# Patient Record
Sex: Female | Born: 1995 | Race: Black or African American | Hispanic: No | Marital: Single | Smoking: Never smoker
Health system: Southern US, Community
[De-identification: ages and names within clinical notes are randomized; demographics above are authoritative.]

## PROBLEM LIST (undated history)

## (undated) ENCOUNTER — Emergency Department: Payer: Medicaid Other

## (undated) HISTORY — PX: DENTAL SURGERY: SHX609

---

## 2017-01-01 ENCOUNTER — Encounter: Payer: Self-pay | Admitting: Emergency Medicine

## 2017-01-01 ENCOUNTER — Emergency Department
Admission: EM | Admit: 2017-01-01 | Discharge: 2017-01-01 | Disposition: A | Discharge status: Home / Self Care | Payer: Self-pay | Attending: Emergency Medicine | Admitting: Emergency Medicine

## 2017-01-01 DIAGNOSIS — J029 Acute pharyngitis, unspecified: Secondary | ICD-10-CM | POA: Insufficient documentation

## 2017-01-01 LAB — POCT RAPID STREP A: STREPTOCOCCUS, GROUP A SCREEN (DIRECT): NEGATIVE

## 2017-01-01 MED ORDER — AMOXICILLIN 875 MG PO TABS: 875.0000 mg | ORAL_TABLET | Freq: Two times a day (BID) | ORAL | 0 refills | Status: DC

## 2017-01-01 MED ORDER — FLUTICASONE PROPIONATE 50 MCG/ACT NA SUSP: 2.0000 | Freq: Every day | NASAL | 0 refills | Status: DC

## 2017-01-01 NOTE — ED Notes (Signed)
See triage note   States she developed sore throat dry cough and swollen glands for 2 days   Afebrile on arrival

## 2017-01-01 NOTE — ED Notes (Signed)
Strep test performed

## 2017-01-01 NOTE — ED Provider Notes (Signed)
[facility] Emergency Department Provider Note  ____________________________________________  Time seen: Approximately 10:05 AM  I have reviewed the triage vital signs and the nursing notes.   HISTORY  Chief Complaint Sore Throat    HPI Sheryl Harrison is a 21 y.o. female , NAD, presents to the emergency department with a three-day history of sore throat, body aches and chills. Patient states sore throat began approximately 3 days ago. Sore throat has progressively worsened and she feels that her tonsils are swollen. Has had body aches and chills but no fevers or sweats. Has had occasional nausea but no vomiting or diarrhea. Denies any chest pain, shortness breath or abdominal pain. Denies any difficulty swallowing or breathing. No swelling about the lips/tongue/throat. Has had no rashes. No cough or chest congestion. Endorses sinus pressure and ear pressure but no nasal congestion or runny nose. Has a history of seasonal allergies but is not taking anything for such at this time. Denies any sick contacts. Has not had a flu vaccine.   History reviewed. No pertinent past medical history.  There are no active problems to display for this patient.   Past Surgical History:  Procedure Laterality Date  . DENTAL SURGERY      Prior to Admission medications   Medication Sig Start Date End Date Taking? Authorizing Provider  amoxicillin (AMOXIL) 875 MG tablet Take 1 tablet (875 mg total) by mouth 2 (two) times daily. 01/01/17   Tayana Shankle L Elek Holderness, PA-C  fluticasone (FLONASE) 50 MCG/ACT nasal spray Place 2 sprays into both nostrils daily. 01/01/17   Teighan Aubert L Samariya Rockhold, PA-C    Allergies Patient has no known allergies.  History reviewed. No pertinent family history.  Social History Social History  Substance Use Topics  . Smoking status: Never Smoker  . Smokeless tobacco: Never Used  . Alcohol use Yes     Review of Systems  Constitutional: Positive chills. No fever or  sweats. Eyes: No visual changes. No discharge ENT: Positive sore throat, sinus pressure, ear pressure. No nasal congestion, runny nose, ear drainage. Cardiovascular: No chest pain. Respiratory: No cough or chest congestion. No shortness of breath. No wheezing.  Gastrointestinal: Positive nausea without vomiting. No abdominal pain.   No diarrhea.   Musculoskeletal: Positive for general myalgias.  Skin: Negative for rash. Neurological: Negative for headaches. 10-point ROS otherwise negative.  ____________________________________________   PHYSICAL EXAM:  VITAL SIGNS: ED Triage Vitals  Enc Vitals Group     BP 01/01/17 0936 116/66     Pulse Rate 01/01/17 0936 80     Resp 01/01/17 0936 16     Temp 01/01/17 0936 98.3 F (36.8 C)     Temp Source 01/01/17 0936 Oral     SpO2 01/01/17 0936 99 %     Weight 01/01/17 0935 152 lb (68.9 kg)     Height 01/01/17 0935 5\' 2"  (1.575 m)     Head Circumference --      Peak Flow --      Pain Score 01/01/17 0935 7     Pain Loc --      Pain Edu? --      Excl. in GC? --      Constitutional: Alert and oriented. Well appearing and in no acute distress. Eyes: Conjunctivae are normal.  Head: Atraumatic. ENT:      Ears: TMs visual eyes bilaterally without erythema, bulging, effusion or perforation.      Nose: No congestion/rhinnorhea.      Mouth/Throat: Mucous membranes  are moist. Trace bilateral tonsillar swelling with mild injection but no exudate. Posterior pharynx with mild cobblestoning but no erythema or swelling. Clear postnasal drip. Uvula is midline. Airway is patent. Neck: No stridor. Supple with full range of motion. Hematological/Lymphatic/Immunilogical: Positive left, anterior, focal cervical lymphadenopathy with tenderness to palpation but is mobile. Cardiovascular: Normal rate, regular rhythm. Normal S1 and S2.  Good peripheral circulation. Respiratory: Normal respiratory effort without tachypnea or retractions. Lungs CTAB with breath  sounds noted in all lung fields. No wheeze, rhonchi, rales Neurologic:  Normal speech and language. No gross focal neurologic deficits are appreciated.  Skin:  Skin is warm, dry and intact. No rash noted. Psychiatric: Mood and affect are normal. Speech and behavior are normal. Patient exhibits appropriate insight and judgement.   ____________________________________________   LABS (all labs ordered are listed, but only abnormal results are displayed)  Labs Reviewed  CULTURE, GROUP A STREP [facility])  POCT RAPID STREP A   ____________________________________________  EKG  None ____________________________________________  RADIOLOGY  None ____________________________________________    PROCEDURES  Procedure(s) performed: None   Procedures   Medications - No data to display   ____________________________________________   INITIAL IMPRESSION / ASSESSMENT AND PLAN / ED COURSE  Pertinent labs & imaging results that were available during my care of the patient were reviewed by me and considered in my medical decision making (see chart for details).  Clinical Course     Patient's diagnosis is consistent with Acute pharyngitis. Rapid strep was negative but symptoms are consistent with bacterial infection and with enlarged lymph nodes I felt it best to treat the patient with antibiotics at this time. Throat culture was ordered and patient will be called with results when available. Patient will be discharged home with prescriptions for amoxicillin and Flonase to take as directed. May take over-the-counter Tylenol or ibuprofen as needed. Patient is to follow up with one of the outpatient community clinics such [facility], [facility], [facility], [facility] if symptoms persist past this treatment course. Patient is given ED precautions to return to the ED for any worsening or new symptoms.     ____________________________________________  FINAL CLINICAL IMPRESSION(S) / ED DIAGNOSES  Final diagnoses:  Acute pharyngitis, unspecified etiology      NEW MEDICATIONS STARTED DURING THIS VISIT:  Discharge Medication List as of 01/01/2017 10:42 AM    START taking these medications   Details  amoxicillin (AMOXIL) 875 MG tablet Take 1 tablet (875 mg total) by mouth 2 (two) times daily., Starting Wed 01/01/2017, Print    fluticasone (FLONASE) 50 MCG/ACT nasal spray Place 2 sprays into both nostrils daily., Starting Wed 01/01/2017, Print             Ernestene KielJami L CottonwoodHagler, PA-C 01/01/17 1100    Sharman CheekPhillip Stafford, MD 01/01/17 [phone]

## 2017-01-01 NOTE — ED Triage Notes (Signed)
C/o sore throat since jan 1. Denies fevers. Pain with swallowing but is handling secretions without difficulty. Tonsils swollen. No respiratory distress.

## 2017-01-03 LAB — CULTURE, GROUP A STREP (THRC)

## 2017-12-30 HISTORY — PX: WISDOM TOOTH EXTRACTION: SHX21

## 2018-04-22 ENCOUNTER — Encounter: Payer: Self-pay | Admitting: Emergency Medicine

## 2018-04-22 ENCOUNTER — Other Ambulatory Visit: Payer: Self-pay

## 2018-04-22 ENCOUNTER — Emergency Department
Admission: EM | Admit: 2018-04-22 | Discharge: 2018-04-22 | Disposition: A | Discharge status: Home / Self Care | Payer: Self-pay | Attending: Emergency Medicine | Admitting: Emergency Medicine

## 2018-04-22 DIAGNOSIS — M722 Plantar fascial fibromatosis: Secondary | ICD-10-CM | POA: Insufficient documentation

## 2018-04-22 MED ORDER — PREDNISONE 10 MG PO TABS: ORAL_TABLET | ORAL | 0 refills | Status: DC

## 2018-04-22 MED ORDER — KETOROLAC TROMETHAMINE 10 MG PO TABS: 10.0000 mg | ORAL_TABLET | Freq: Four times a day (QID) | ORAL | 0 refills | Status: DC | PRN

## 2018-04-22 MED ORDER — KETOROLAC TROMETHAMINE 30 MG/ML IJ SOLN
30.0000 mg | Freq: Once | INTRAMUSCULAR | Status: AC
  Administered 2018-04-22: 30 mg via INTRAMUSCULAR
  Filled 2018-04-22: qty 1

## 2018-04-22 NOTE — ED Provider Notes (Signed)
[facility] Emergency Department Provider Note  ____________________________________________  Time seen: Approximately 2:44 PM  I have reviewed the triage vital signs and the nursing notes.   HISTORY  Chief Complaint Foot Pain    HPI Sheryl Harrison is a 22 y.o. female that presents emergency department for evaluation of left foot pain for 1 week.  Pain is worse in the morning right when she gets up and steps on her foot.  Pain is primarily on the lateral side of her foot.  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  No trauma.  No numbness, tingling.   History reviewed. No pertinent past medical history.  There are no active problems to display for this patient.   Past Surgical History:  Procedure Laterality Date  . DENTAL SURGERY      Prior to Admission medications   Medication Sig Start Date End Date Taking? Authorizing Provider  amoxicillin (AMOXIL) 875 MG tablet Take 1 tablet (875 mg total) by mouth 2 (two) times daily. 01/01/17   Hagler, Jami L, PA-C  fluticasone (FLONASE) 50 MCG/ACT nasal spray Place 2 sprays into both nostrils daily. 01/01/17   Hagler, Jami L, PA-C  ketorolac (TORADOL) 10 MG tablet Take 1 tablet (10 mg total) by mouth every 6 (six) hours as needed. 04/22/18   Enid DerryWagner, Keah Lamba, PA-C  predniSONE (DELTASONE) 10 MG tablet Take 6 tablets on day 1, take 5 tablets on day 2, take 4 tablets on day 3, take 3 tablets on day 4, take 2 tablets on day 5, take 1 tablet on day 6 04/22/18   Enid DerryWagner, Janiya Millirons, PA-C    Allergies Apple  No family history on file.  Social History Social History   Tobacco Use  . Smoking status: Never Smoker  . Smokeless tobacco: Never Used  Substance Use Topics  . Alcohol use: Yes  . Drug use: No     Review of Systems  Constitutional: No fever/chills Gastrointestinal: No abdominal pain.  No nausea, no vomiting.  Musculoskeletal: Positive for foot pain.  Skin: Negative for rash, abrasions,  lacerations, ecchymosis. Neurological: Negative for headaches, numbness or tingling   ____________________________________________   PHYSICAL EXAM:  VITAL SIGNS: ED Triage Vitals  Enc Vitals Group     BP 04/22/18 1344 114/75     Pulse Rate 04/22/18 1344 77     Resp 04/22/18 1344 18     Temp 04/22/18 1344 98.2 F (36.8 C)     Temp Source 04/22/18 1344 Oral     SpO2 04/22/18 1344 99 %     Weight 04/22/18 1344 157 lb (71.2 kg)     Height 04/22/18 1344 5\' 2"  (1.575 m)     Head Circumference --      Peak Flow --      Pain Score 04/22/18 1346 8     Pain Loc --      Pain Edu? --      Excl. in GC? --      Constitutional: Alert and oriented. Well appearing and in no acute distress. Eyes: Conjunctivae are normal. PERRL. EOMI. Head: Atraumatic. ENT:      Ears:      Nose: No congestion/rhinnorhea.      Mouth/Throat: Mucous membranes are moist.  Neck: No stridor.   Cardiovascular: Good peripheral circulation. Respiratory: Normal respiratory effort without tachypnea or retractions.  Musculoskeletal: Full range of motion to all extremities. No gross deformities appreciated.  No calf tenderness.  No  swelling or erythema.  Tenderness to palpation over lateral plantar side of left foot.  Full range of motion of toes.  No wounds. Neurologic:  Normal speech and language. No gross focal neurologic deficits are appreciated.  Skin:  Skin is warm, dry and intact. No rash noted.   ____________________________________________   LABS (all labs ordered are listed, but only abnormal results are displayed)  Labs Reviewed - No data to display ____________________________________________  EKG   ____________________________________________  RADIOLOGY   No results found.  ____________________________________________    PROCEDURES  Procedure(s) performed:    Procedures    Medications  ketorolac (TORADOL) 30 MG/ML injection 30 mg (30 mg Intramuscular Given 04/22/18 1447)      ____________________________________________   INITIAL IMPRESSION / ASSESSMENT AND PLAN / ED COURSE  Pertinent labs & imaging results that were available during my care of the patient were reviewed by me and considered in my medical decision making (see chart for details).  Review of the [place] CSRS was performed in accordance of the [facility] prior to dispensing any controlled drugs.     Patient's diagnosis is consistent with plantar fasciitis.  Vital signs and exam are reassuring. We discussed that imaging is unlikely to be beneficial and patient is agreeable. IM Toradol was given.  She is requesting crutches. Patient will be discharged home with prescriptions for Toradol and prednisone. Patient is to follow up with podiatry as directed. Patient is given ED precautions to return to the ED for any worsening or new symptoms.     ____________________________________________  FINAL CLINICAL IMPRESSION(S) / ED DIAGNOSES  Final diagnoses:  Plantar fasciitis      NEW MEDICATIONS STARTED DURING THIS VISIT:  ED Discharge Orders        Ordered    predniSONE (DELTASONE) 10 MG tablet     04/22/18 1448    ketorolac (TORADOL) 10 MG tablet  Every 6 hours PRN     04/22/18 1448          This chart was dictated using voice recognition software/Dragon. Despite best efforts to proofread, errors can occur which can change the meaning. Any change was purely unintentional.    Enid Derry, PA-C 04/22/18 1502    Phineas Semen, MD 04/22/18 [phone]

## 2018-04-22 NOTE — ED Triage Notes (Signed)
States he woke up with pain and swelling to left foot    Pain is mainly to lateral foot and the sole of foot

## 2018-10-14 LAB — HM PAP SMEAR: HM Pap smear: NORMAL

## 2019-03-16 DIAGNOSIS — E669 Obesity, unspecified: Secondary | ICD-10-CM | POA: Insufficient documentation

## 2019-03-16 LAB — HM HIV SCREENING LAB: HM HIV Screening: NEGATIVE

## 2020-05-11 ENCOUNTER — Other Ambulatory Visit: Payer: Self-pay

## 2020-05-11 ENCOUNTER — Encounter: Payer: Self-pay | Admitting: Family Medicine

## 2020-05-11 ENCOUNTER — Ambulatory Visit (LOCAL_COMMUNITY_HEALTH_CENTER): Payer: Medicaid Other | Admitting: Family Medicine

## 2020-05-11 VITALS — BP 102/57 | Ht 62.0 in | Wt 176.4 lb

## 2020-05-11 DIAGNOSIS — Z3009 Encounter for other general counseling and advice on contraception: Secondary | ICD-10-CM | POA: Diagnosis not present

## 2020-05-11 DIAGNOSIS — Z6830 Body mass index (BMI) 30.0-30.9, adult: Secondary | ICD-10-CM

## 2020-05-11 DIAGNOSIS — Z113 Encounter for screening for infections with a predominantly sexual mode of transmission: Secondary | ICD-10-CM

## 2020-05-11 MED ORDER — NORETHIN ACE-ETH ESTRAD-FE 1.5-30 MG-MCG PO TABS
1.0000 | ORAL_TABLET | Freq: Every day | ORAL | 0 refills | Status: DC
Start: 2020-05-11 — End: 2020-05-11

## 2020-05-11 MED ORDER — NORETHIN ACE-ETH ESTRAD-FE 1-20 MG-MCG PO TABS
1.0000 | ORAL_TABLET | Freq: Every day | ORAL | 3 refills | Status: AC
Start: 2020-05-11 — End: ?

## 2020-05-11 NOTE — Progress Notes (Signed)
Family Planning Visit- Initial Visit  Subjective:  Sheryl Harrison is a 24 y.o.  No obstetric history on file.  being seen today for an initial well woman visit and to discuss family planning options. Patient reports they do nto want a pregnancy in the next year.   Chief Complaint  Patient presents with  . Annual Exam  . Contraception    Pt has Obesity, unspecified; Allergic rhinitis; and Chronic headaches on their problem list.   HPI  Patient reports she is here for physical exam and to discuss [facility] options. She is interested in the pill.   She is late for her period. Had bleeding 4/3 but it was shorter, abn period. Endorses occ dizziness, nausea.   Pt does not meet any of the following contraindications to estrogen use: -Age ?35 years and smoking ?15 cigarettes per day -Migraine with aura (she has had HA recently, denies vision problems w/these) -Two or more RF for arterial CVD (such as older age, smoking, diabetes, and hypertension) -HTN -Breast cancer -VTE hx or acute event -Known thrombogenic mutations -Known ischemic heart disease -History of stroke -Complicated valvular heart disease (pulmonary HTN, risk for afib, hx subacute bacterial endocarditis) -Cirrhosis, Hepatocellular adenoma or malignant hepatoma    Patient's last menstrual period was 04/01/2020 (exact date). Last sex: 03/11/20 BCM: none Pt desires EC? n/a  Last pap: 10/14/2018 = normal  Last breast exam: 1 year ago Personal/family hx breast cancer? no  Patient reports 1 partner(s) in last year. Do they desire STI screening (if no, why not)? Yes. Denies symptoms.   Does the patient desire a pregnancy in the next year? no   24 y.o., Body mass index is 32.26 kg/m. - Is patient eligible for HA1C diabetes screening based on BMI and age >49?  no  Has patient been screened once for HCV in the past?  no  No results found for: HCVAB  Does the patient have current of drug use, have a partner with drug use,  and/or has been incarcerated since last result? no If yes-- Screen for HCV through [facility]   Does the patient meet criteria for HBV testing? no  Criteria:  -Household, sexual or needle sharing contact with HBV -History of drug use -HIV positive -Those with known Hep C  See flowsheet for other program required questions.    Health Maintenance Due  Topic Date Due  . COVID-19 Vaccine (1) Never done  . TETANUS/TDAP  Never done    ROS HA: come/go past few months. Denies vision disturbances with these. Dizziness: x3 wks Nausea: x3 wks   The following portions of the patient's history were reviewed and updated as appropriate: allergies, current medications, past family history, past medical history, past social history, past surgical history and problem list. Problem list updated.   See flowsheet for other program required questions.  Objective:   Vitals:   05/11/20 1535  BP: (!) 102/57  Weight: 176 lb 6.4 oz (80 kg)  Height: 5\' 2"  (1.575 m)    Vitals and nursing note reviewed.  Constitutional:      Appearance: Normal appearance.  HENT:     Head: Normocephalic and atraumatic.     Mouth/Throat:     Mouth: Mucous membranes are moist.     Pharynx: Oropharynx is clear. No oropharyngeal exudate or posterior oropharyngeal erythema.  Eyes:     Conjunctiva/sclera: Conjunctivae normal.  Neck:     Thyroid: No thyroid mass, thyromegaly or thyroid tenderness.  Cardiovascular:  Rate and Rhythm: Normal rate and regular rhythm.     Pulses: Normal pulses.     Heart sounds: Normal heart sounds.  Pulmonary:     Effort: Pulmonary effort is normal.     Breath sounds: Normal breath sounds.  Abdominal:     General: Abdomen is flat.     Palpations: There is no mass.     Tenderness: There is no abdominal tenderness. There is no rebound.  Genitourinary:     General: Normal vulva.     Exam position: Lithotomy position.     Pubic Area: No rash or pubic lice.      Labia:         Right: No rash or lesion.        Left: No rash or lesion.      Vagina: Normal. No vaginal erythema, bleeding or lesions. +white discharge, ph>4.5    Cervix: No cervical motion tenderness, discharge, friability, lesion or erythema.     Uterus: Normal.      Adnexa: Right adnexa normal and left adnexa normal.     Rectum: Normal.  Lymphadenopathy:     Head:     Right side of head: No preauricular or posterior auricular adenopathy.     Left side of head: No preauricular or posterior auricular adenopathy.     Cervical: No cervical adenopathy.     Upper Body:     Right upper body: No supraclavicular or axillary adenopathy.     Left upper body: No supraclavicular or axillary adenopathy.     Lower Body: No right inguinal adenopathy. No left inguinal adenopathy.  Skin:    General: Skin is warm and dry.     Findings: No rash.  Neurological:     Mental Status: She is alert and oriented to person, place, and time.       Assessment and Plan:  Sheryl Harrison is a 24 y.o. female presenting to the [facility] Department for an initial well woman exam/family planning visit.  Contraception counseling: Reviewed all forms of birth control options in the tiered based approach. available including abstinence; over the counter/barrier methods; hormonal contraceptive medication including pill, patch, ring, injection,contraceptive implant, ECP; hormonal and nonhormonal IUDs; permanent sterilization options including vasectomy and the various tubal sterilization modalities. Risks, benefits, and typical effectiveness rates were reviewed.  Questions were answered.  Written information was also given to the patient to review.  Patient desires OCP, this was prescribed for patient. She will follow up in  1 year for surveillance.  She was told to call with any further questions, or with any concerns about this method of contraception.  Emphasized use of condoms 100% of the time for STI  prevention.  Emergency Contraception: n/a   1. Family planning services -BCM: rx OCP x1 yr today. Counseling as above. She will see PCP for f/u of HAs, but advised if HA worsening w/OCP to RTC to switch BCM. -Pap: due next year -CBE: due next year -Recommended PCP f/u for HA, nausea, dizziness. Handout w/local providers given. - Pregnancy, urine - norethindrone-ethinyl estradiol-iron (MICROGESTIN FE 1.5/30) 1.5-30 MG-MCG tablet; Take 1 tablet by mouth daily.  Dispense: 3 Packages; Refill: 3  2. Screening examination for venereal disease -Pt without symptoms. Screenings today as below. Treat wet prep per standing order. -Patient does not meet criteria for HepB, HepC Screening.  -Counseled on warning s/sx and when to seek care. Recommended condom use with all sex and discussed importance of condom use for  STI prevention. - WET PREP FOR TRICH, YEAST, CLUE - Chlamydia/Gonorrhea [facility] - HIV [place] [facility] - Syphilis Serology,  Lab     Return in about 1 year (around 05/11/2021) for yearly wellness exam.  No future appointments.  Ann Held, PA-C

## 2020-05-11 NOTE — Progress Notes (Signed)
Patient here for PE and to discuss BCM, considering OC. Last PE 03/16/2019, Last Pap 10/14/2018, NIL. Next Pap due 2020 per [facility].Burt Knack, RN

## 2020-05-11 NOTE — Progress Notes (Signed)
Wet mount reviewed, no treatment indicated. PT negative. Patient signed OC consent, and will pick up prescription at pharmacy.Burt Knack, RN

## 2020-05-12 ENCOUNTER — Encounter: Payer: Self-pay | Admitting: Family Medicine

## 2020-05-12 LAB — WET PREP FOR TRICH, YEAST, CLUE
Trichomonas Exam: NEGATIVE
Yeast Exam: NEGATIVE

## 2020-05-12 LAB — PREGNANCY, URINE: Preg Test, Ur: NEGATIVE

## 2020-08-01 ENCOUNTER — Ambulatory Visit (LOCAL_COMMUNITY_HEALTH_CENTER): Payer: Medicaid Other

## 2020-08-01 ENCOUNTER — Other Ambulatory Visit: Payer: Self-pay

## 2020-08-01 VITALS — BP 110/76 | Ht 62.0 in | Wt 178.0 lb

## 2020-08-01 DIAGNOSIS — Z3202 Encounter for pregnancy test, result negative: Secondary | ICD-10-CM

## 2020-08-01 LAB — PREGNANCY, URINE: Preg Test, Ur: NEGATIVE

## 2020-08-01 NOTE — Progress Notes (Signed)
UPT negative today. LMP 06/23/20. Last sex 07/01/20. Reports using ECP day after 07/01/20. Marland Kitchen Has ocp but has not started yet. Reports wanting to determine if preg first. Negative results given and explained today. Community provider list given. States she wants to find out why period not starting. Declines multivitamins, reports supply at home. Jerel Shepherd, RN

## 2020-12-29 ENCOUNTER — Emergency Department: Payer: Medicaid Other

## 2020-12-29 ENCOUNTER — Other Ambulatory Visit: Payer: Self-pay

## 2020-12-29 ENCOUNTER — Emergency Department
Admission: EM | Admit: 2020-12-29 | Discharge: 2020-12-29 | Disposition: A | Discharge status: Home / Self Care | Payer: Medicaid Other | Attending: Emergency Medicine | Admitting: Emergency Medicine

## 2020-12-29 DIAGNOSIS — M722 Plantar fascial fibromatosis: Secondary | ICD-10-CM | POA: Diagnosis not present

## 2020-12-29 DIAGNOSIS — M79671 Pain in right foot: Secondary | ICD-10-CM | POA: Diagnosis present

## 2020-12-29 MED ORDER — NAPROXEN 500 MG PO TABS: 500.0000 mg | ORAL_TABLET | Freq: Two times a day (BID) | ORAL | Status: DC

## 2020-12-29 NOTE — ED Provider Notes (Signed)
[facility] Emergency Department Provider Note   ____________________________________________   Event Date/Time   First MD Initiated Contact with Patient 12/29/20 1229     (approximate)  I have reviewed the triage vital signs and the nursing notes.   HISTORY  Chief Complaint Foot Pain    HPI Sheryl Harrison is a 24 y.o. female patient presents with a complaint of bilateral posterior medial foot pain.  States right greater than left.  Patient was diagnosed in 2019 with plantar fasciitis.  There is no imaging or podiatry evaluation and records.  Patient state in the past week she has increasing pain and today she cannot work secondary to the complaint.  Patient was told by her employee to have her complaint evaluated and provide him with description of her complaint.  Patient rates her pain as 8/10.  Patient describes pain as "aching".  Patient said no relief with inserts and anti-inflammatory medications.      No past medical history on file.  Patient Active Problem List   Diagnosis Date Noted  . Obesity, unspecified 03/16/2019  . Allergic rhinitis 03/11/2012  . Chronic headaches 03/11/2012    Past Surgical History:  Procedure Laterality Date  . DENTAL SURGERY    . WISDOM TOOTH EXTRACTION  2019    Prior to Admission medications   Medication Sig Start Date End Date Taking? Authorizing Provider  naproxen (NAPROSYN) 500 MG tablet Take 1 tablet (500 mg total) by mouth 2 (two) times daily with a meal. 12/29/20  Yes Joni Reining, PA-C  fluticasone [facility]) 50 MCG/ACT nasal spray Place 2 sprays into both nostrils daily. Patient not taking: Reported on 08/01/2020 01/01/17   Hagler, Jami L, PA-C  norethindrone-ethinyl estradiol (MICROGESTIN FE 1/20) 1-20 MG-MCG tablet Take 1 tablet by mouth daily. Patient not taking: Reported on 08/01/2020 05/11/20   Ann Held, PA-C    Allergies Apple  Family History  Problem Relation Age of Onset  .  Fibromyalgia Mother   . Cerebral palsy Sister   . Heart attack Sister   . Diabetes type I Brother   . Breast cancer Neg Hx   . Thrombosis Neg Hx     Social History Social History   Tobacco Use  . Smoking status: Never Smoker  . Smokeless tobacco: Never Used  Vaping Use  . Vaping Use: Never used  Substance Use Topics  . Alcohol use: Not Currently    Comment: 3-4x/yr  . Drug use: No    Review of Systems  Constitutional: No fever/chills Eyes: No visual changes. ENT: No sore throat. Cardiovascular: Denies chest pain. Respiratory: Denies shortness of breath. Gastrointestinal: No abdominal pain.  No nausea, no vomiting.  No diarrhea.  No constipation. Genitourinary: Negative for dysuria. Musculoskeletal: Bilateral foot pain  skin: Negative for rash. Neurological: Negative for headaches, focal weakness or numbness. Allergic/Immunilogical: Apples ____________________________________________   PHYSICAL EXAM:  VITAL SIGNS: ED Triage Vitals  Enc Vitals Group     BP 12/29/20 1103 118/73     Pulse Rate 12/29/20 1103 80     Resp 12/29/20 1103 18     Temp 12/29/20 1103 98.5 F (36.9 C)     Temp Source 12/29/20 1103 Oral     SpO2 12/29/20 1103 98 %     Weight 12/29/20 1100 174 lb (78.9 kg)     Height 12/29/20 1100 5\' 2"  (1.575 m)     Head Circumference --      Peak Flow --  Pain Score 12/29/20 1100 8     Pain Loc --      Pain Edu? --      Excl. in [place]? --    Constitutional: Alert and oriented. Well appearing and in no acute distress. Cardiovascular: Normal rate, regular rhythm. Grossly normal heart sounds.  Good peripheral circulation. Respiratory: Normal respiratory effort.  No retractions. Lungs CTAB. Musculoskeletal: Patient ambulates atypical gait.  No obvious deformity to the bilateral foot.  Patient has moderate guarding palpation of the bilateral plantar aspect of the foot.   Neurologic:  Normal speech and language. No gross focal neurologic deficits are  appreciated. No gait instability. Skin:  Skin is warm, dry and intact. No rash noted.  No edema, erythema or ecchymosis. Psychiatric: Mood and affect are normal. Speech and behavior are normal.  ____________________________________________   LABS (all labs ordered are listed, but only abnormal results are displayed)  Labs Reviewed - No data to display ____________________________________________  EKG   ____________________________________________  RADIOLOGY I, Joni Reining, personally viewed and evaluated these images (plain radiographs) as part of my medical decision making, as well as reviewing the written report by the radiologist.  ED MD interpretation: No acute findings on x-ray of the right ankle.  Official radiology report(s): DG Ankle Complete Left  Result Date: 12/29/2020 CLINICAL DATA:  Left foot pain.  No known injury. EXAM: LEFT ANKLE COMPLETE - 3+ VIEW COMPARISON:  None. FINDINGS: There is no evidence of fracture, dislocation, or joint effusion. There is no evidence of arthropathy or other focal bone abnormality. Soft tissues are unremarkable. IMPRESSION: Normal exam. Electronically Signed   By: Drusilla Kanner M.D.   On: 12/29/2020 13:30    ____________________________________________   PROCEDURES  Procedure(s) performed (including Critical Care):  Procedures   ____________________________________________   INITIAL IMPRESSION / ASSESSMENT AND PLAN / ED COURSE  As part of my medical decision making, I reviewed the following data within the electronic MEDICAL RECORD NUMBER         Patient presents with bilateral plantar foot pain.  Discussed no acute findings x-ray of the left ankle/foot.  Patient complaint physical exam consistent with plantar fasciitis.  Patient given discharge care instruction advised follow-up podiatry for definitive evaluation and treatment.      ____________________________________________   FINAL CLINICAL IMPRESSION(S) / ED  DIAGNOSES  Final diagnoses:  Plantar fasciitis, bilateral     ED Discharge Orders         Ordered    naproxen (NAPROSYN) 500 MG tablet  2 times daily with meals        12/29/20 1332          *Please note:  Sheryl Harrison was evaluated in Emergency Department on 12/29/2020 for the symptoms described in the history of present illness. She was evaluated in the context of the global COVID-19 pandemic, which necessitated consideration that the patient might be at risk for infection with the SARS-CoV-2 virus that causes COVID-19. Institutional protocols and algorithms that pertain to the evaluation of patients at risk for COVID-19 are in a state of rapid change based on information released by regulatory bodies including the CDC and federal and state organizations. These policies and algorithms were followed during the patient's care in the ED.  Some ED evaluations and interventions may be delayed as a result of limited staffing during and the pandemic.*   Note:  This document was prepared using Dragon voice recognition software and may include unintentional dictation errors.    Nona Dell  Kirtland Bouchard, PA-C 12/29/20 1336    Dionne Bucy, MD 12/29/20 1553

## 2020-12-29 NOTE — Discharge Instructions (Signed)
Follow discharge care instruction take medication as directed.  Contact the podiatry department in 3 days to schedule appointment for definitive evaluation and treatment.

## 2020-12-29 NOTE — ED Triage Notes (Signed)
Pt arrived via POV with reports of hx plantar fasciitis. Pt states she is on her feet all day at work and needs a note that tell what her condition is. Pt states she also wants to see what we can do for her pain.

## 2021-07-17 ENCOUNTER — Other Ambulatory Visit: Payer: Self-pay

## 2021-07-17 ENCOUNTER — Ambulatory Visit (INDEPENDENT_AMBULATORY_CARE_PROVIDER_SITE_OTHER): Payer: Medicaid Other

## 2021-07-17 ENCOUNTER — Ambulatory Visit (INDEPENDENT_AMBULATORY_CARE_PROVIDER_SITE_OTHER): Payer: Medicaid Other | Admitting: Podiatry

## 2021-07-17 DIAGNOSIS — M722 Plantar fascial fibromatosis: Secondary | ICD-10-CM

## 2021-07-17 DIAGNOSIS — M79671 Pain in right foot: Secondary | ICD-10-CM

## 2021-07-19 ENCOUNTER — Encounter: Payer: Self-pay | Admitting: Podiatry

## 2021-07-19 NOTE — Progress Notes (Signed)
  Subjective:  Patient ID: Sheryl Harrison, female    DOB: [birth year],  MRN: [id number]  Chief Complaint  Patient presents with   Foot Pain    Bilateral foot pain for about 2 years  PT stated that her feet will swell and she gets a lot of swelling in her feet toes are numb does have a burning sensation pain in the back of the leg at times     25 y.o. female presents with the above complaint.  Patient presents with bilateral heel pain that has been going for about 2 years.  Patient states she has been no flares up and recently has been getting worse and worse.  Patient stated hurts to take the first up in the morning.  She works a lot on her foot.  She denies any other acute complaint she would like to discuss treatment options for this.  She has not seen anyone else prior to seeing me.   Review of Systems: Negative except as noted in the HPI. Denies N/V/F/Ch.  No past medical history on file.  Current Outpatient Medications:    fluticasone (FLONASE) 50 MCG/ACT nasal spray, Place 2 sprays into both nostrils daily. (Patient not taking: Reported on 08/01/2020), Disp: 16 g, Rfl: 0   naproxen (NAPROSYN) 500 MG tablet, Take 1 tablet (500 mg total) by mouth 2 (two) times daily with a meal., Disp: 20 tablet, Rfl: 00   norethindrone-ethinyl estradiol (MICROGESTIN FE 1/20) 1-20 MG-MCG tablet, Take 1 tablet by mouth daily. (Patient not taking: Reported on 08/01/2020), Disp: 3 Package, Rfl: 3  Social History   Tobacco Use  Smoking Status Never  Smokeless Tobacco Never    Allergies  Allergen Reactions   Apple Swelling   Objective:  There were no vitals filed for this visit. There is no height or weight on file to calculate BMI. Constitutional Well developed. Well nourished.  Vascular Dorsalis pedis pulses palpable bilaterally. Posterior tibial pulses palpable bilaterally. Capillary refill normal to all digits.  No cyanosis or clubbing noted. Pedal hair growth normal.  Neurologic Normal  speech. Oriented to person, place, and time. Epicritic sensation to light touch grossly present bilaterally.  Dermatologic Nails well groomed and normal in appearance. No open wounds. No skin lesions.  Orthopedic: Normal joint ROM without pain or crepitus bilaterally. No visible deformities. Tender to palpation at the calcaneal tuber bilaterally. No pain with calcaneal squeeze bilaterally. Ankle ROM diminished range of motion bilaterally. Silfverskiold Test: positive bilaterally.   Radiographs: Taken and reviewed. No acute fractures or dislocations. No evidence of stress fracture.  Plantar heel spur absent. Posterior heel spur absent.   Assessment:   1. Plantar fasciitis of right foot   2. Plantar fasciitis of left foot    Plan:  Patient was evaluated and treated and all questions answered.  Plantar Fasciitis, bilaterally - XR reviewed as above.  - Educated on icing and stretching. Instructions given.  - Injection delivered to the plantar fascia as below. - DME: None - Pharmacologic management: None  Procedure: Injection Tendon/Ligament Location: Bilateral plantar fascia at the glabrous junction; medial approach. Skin Prep: alcohol Injectate: 0.5 cc 0.5% marcaine plain, 0.5 cc of 1% Lidocaine, 0.5 cc kenalog 10. Disposition: Patient tolerated procedure well. Injection site dressed with a band-aid.  No follow-ups on file.

## 2021-07-25 DIAGNOSIS — M79676 Pain in unspecified toe(s): Secondary | ICD-10-CM

## 2021-08-08 IMAGING — DX DG ANKLE COMPLETE 3+V*L*
3 series · 3 of 3 positions shown · non-contrast
Comparison: None.

CLINICAL DATA: Left foot pain.  No known injury.

EXAM:
LEFT ANKLE COMPLETE - 3+ VIEW

[ankle ap]
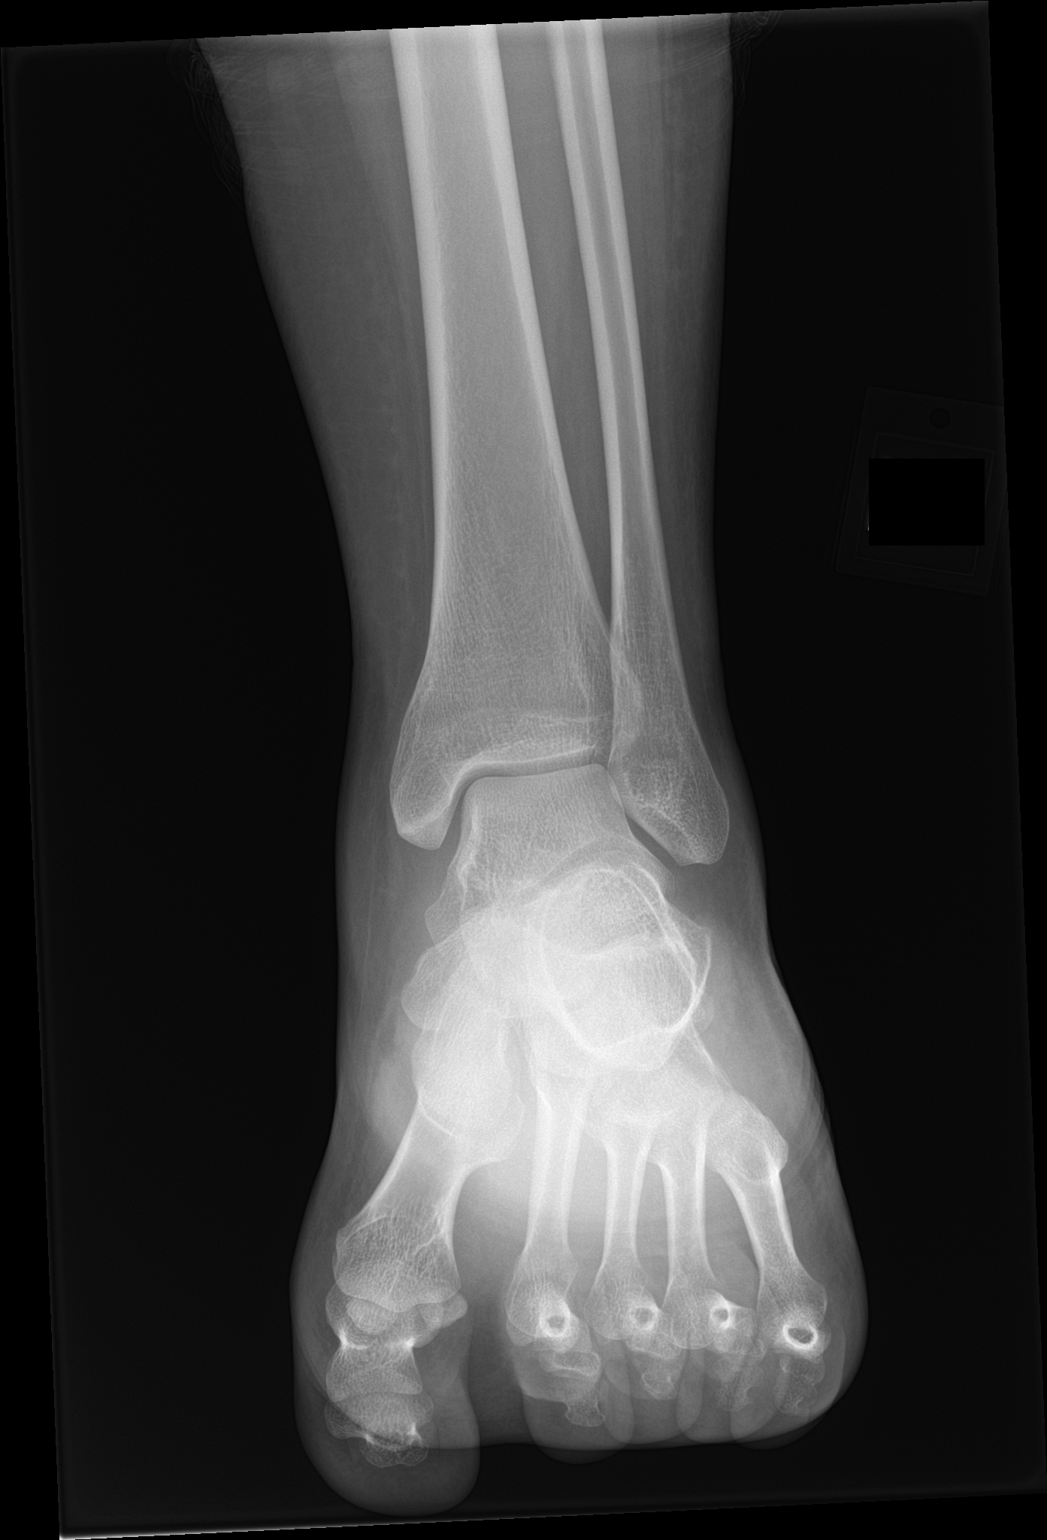

[ankle obl]
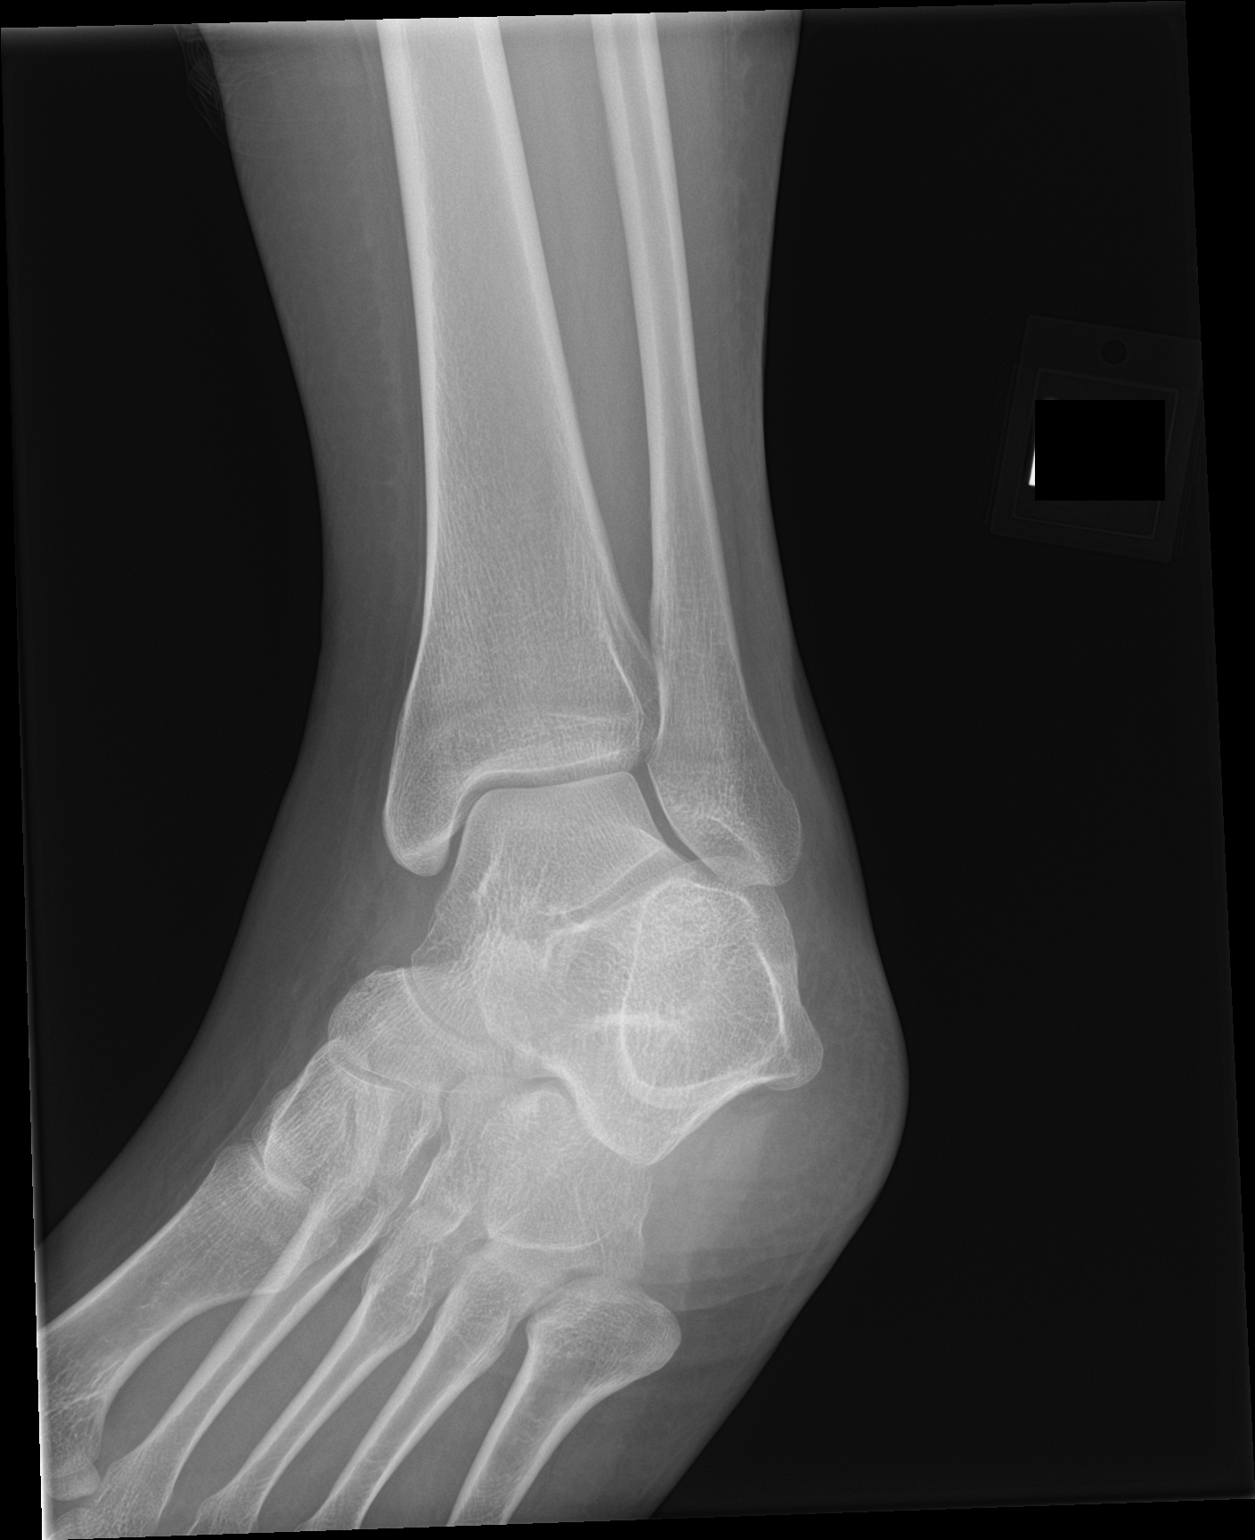

[ankle lat]
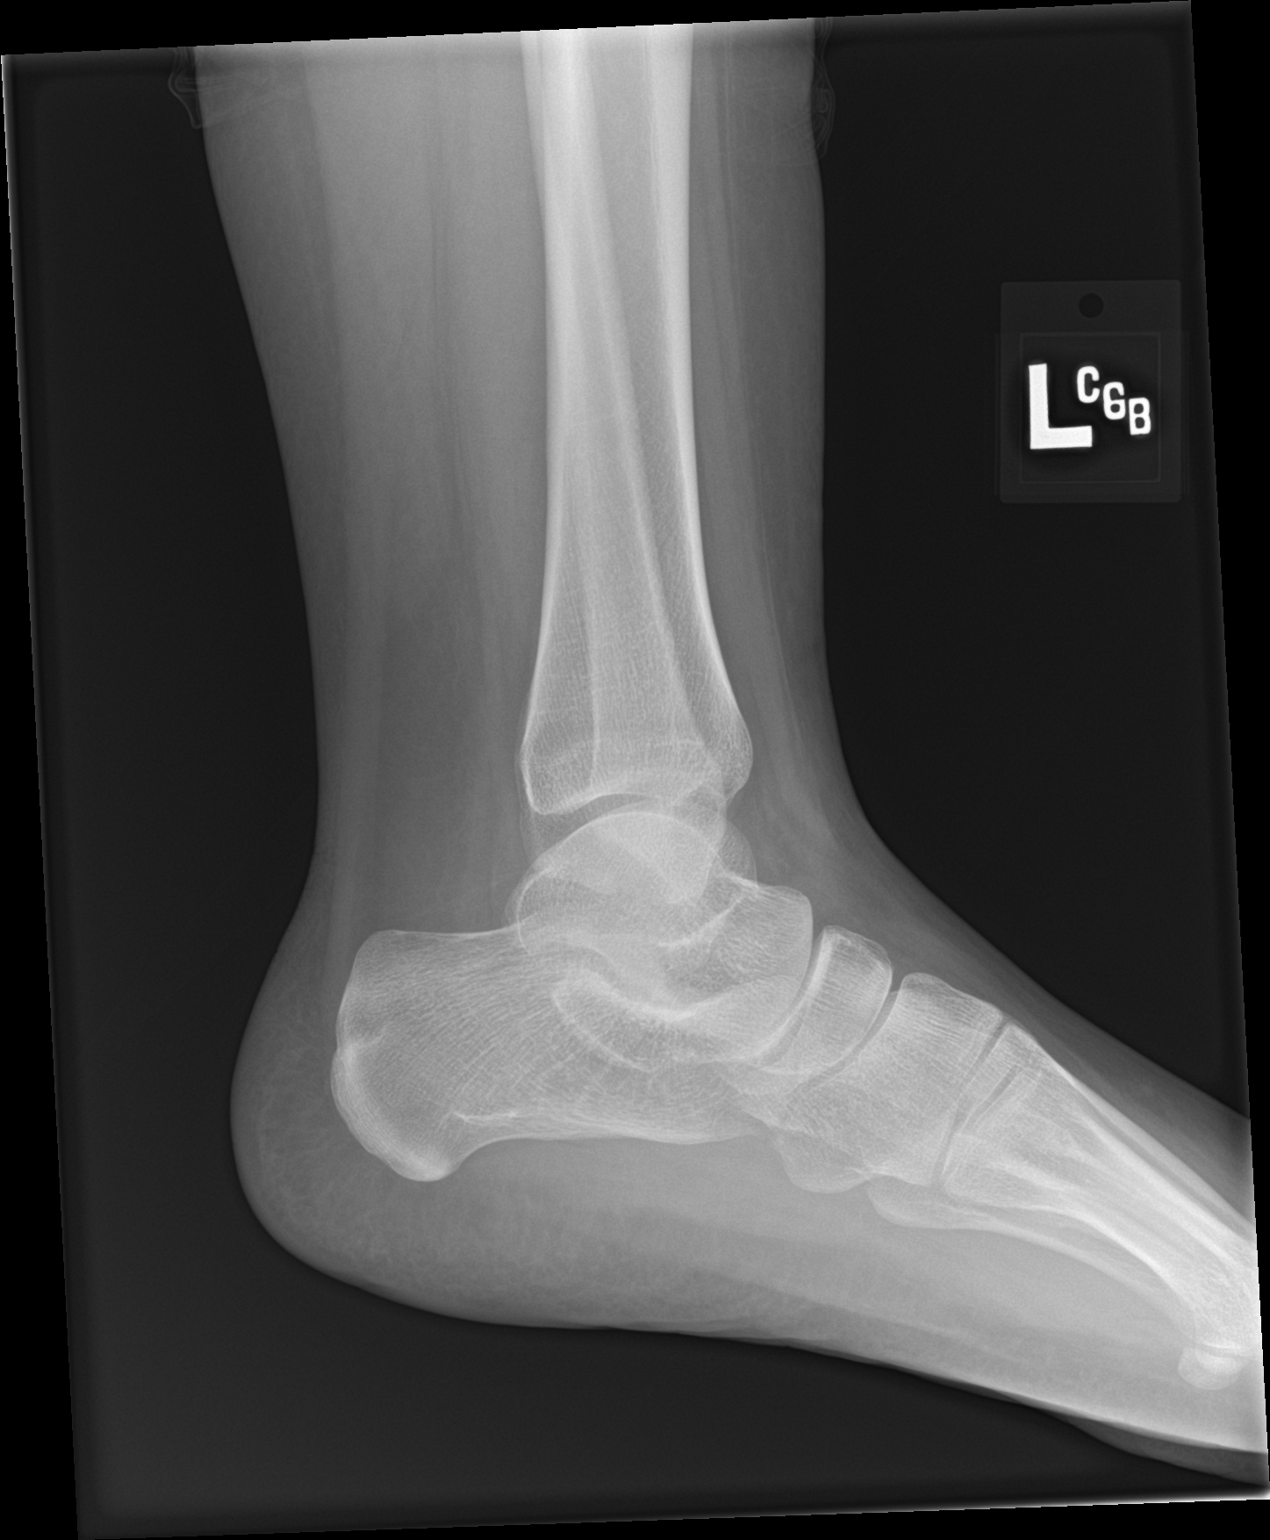

[3 of 3 positions shown; findings below may reference images not displayed]

FINDINGS: There is no evidence of fracture, dislocation, or joint effusion.
There is no evidence of arthropathy or other focal bone abnormality.
Soft tissues are unremarkable.
IMPRESSION: Normal exam.

## 2021-08-14 ENCOUNTER — Ambulatory Visit: Payer: Medicaid Other | Admitting: Podiatry

## 2023-01-22 ENCOUNTER — Encounter: Payer: Self-pay | Admitting: Emergency Medicine

## 2023-01-22 ENCOUNTER — Ambulatory Visit
Admission: EM | Admit: 2023-01-22 | Discharge: 2023-01-22 | Disposition: A | Discharge status: Home / Self Care | Payer: Medicaid Other | Attending: Physician Assistant | Admitting: Physician Assistant

## 2023-01-22 DIAGNOSIS — J069 Acute upper respiratory infection, unspecified: Secondary | ICD-10-CM | POA: Insufficient documentation

## 2023-01-22 DIAGNOSIS — Z1152 Encounter for screening for COVID-19: Secondary | ICD-10-CM | POA: Diagnosis not present

## 2023-01-22 DIAGNOSIS — R059 Cough, unspecified: Secondary | ICD-10-CM | POA: Diagnosis not present

## 2023-01-22 LAB — RESP PANEL BY RT-PCR (RSV, FLU A&B, COVID)  RVPGX2
Influenza A by PCR: NEGATIVE
Influenza B by PCR: NEGATIVE
Resp Syncytial Virus by PCR: NEGATIVE
SARS Coronavirus 2 by RT PCR: NEGATIVE

## 2023-01-22 MED ORDER — PROMETHAZINE-DM 6.25-15 MG/5ML PO SYRP: 5.0000 mL | ORAL_SOLUTION | Freq: Four times a day (QID) | ORAL | 0 refills | Status: DC | PRN

## 2023-01-22 NOTE — ED Provider Notes (Signed)
[facility]    CSN: [id number] Arrival date & time: 01/22/23  1808      History   Chief Complaint Chief Complaint  Patient presents with   Cough    HPI Sheryl Harrison is a 27 y.o. female presenting for 2-day history of dry cough.  Patient says for the past week and a half she has been having congestion.  She states that she had a lot of sinus pressure, postnasal drainage, some wheezing.  She states those symptoms resolved and now she just has a dry cough for the past 2 days.  She denies any associated fever.  Has not had any sinus pain.  No sore throat at this time or chest pain and she denies any breathing difficulty or wheezing at this time.  Her mother and sibling are sick with similar symptoms and patient would like a COVID test.  She has not been taking any medicine for symptoms over-the-counter.  No other concerns.  HPI  History reviewed. No pertinent past medical history.  Patient Active Problem List   Diagnosis Date Noted   Obesity, unspecified 03/16/2019   Allergic rhinitis 03/11/2012   Chronic headaches 03/11/2012    Past Surgical History:  Procedure Laterality Date   DENTAL SURGERY     WISDOM TOOTH EXTRACTION  2019    OB History     Gravida  1   Para  0   Term  0   Preterm  0   AB  1   Living  0      SAB  0   IAB  1   Ectopic  0   Multiple  0   Live Births  0            Home Medications    Prior to Admission medications   Medication Sig Start Date End Date Taking? Authorizing Provider  promethazine-dextromethorphan (PROMETHAZINE-DM) 6.25-15 MG/5ML syrup Take 5 mLs by mouth 4 (four) times daily as needed. 01/22/23  Yes Eusebio Friendly B, PA-C  fluticasone (FLONASE) 50 MCG/ACT nasal spray Place 2 sprays into both nostrils daily. Patient not taking: Reported on 08/01/2020 01/01/17   Hagler, Jami L, PA-C  naproxen (NAPROSYN) 500 MG tablet Take 1 tablet (500 mg total) by mouth 2 (two) times daily with a meal. 12/29/20   Joni Reining, PA-C  norethindrone-ethinyl estradiol (MICROGESTIN FE 1/20) 1-20 MG-MCG tablet Take 1 tablet by mouth daily. Patient not taking: Reported on 08/01/2020 05/11/20   Ann Held, PA-C    Family History Family History  Problem Relation Age of Onset   Fibromyalgia Mother    Cerebral palsy Sister    Heart attack Sister    Diabetes type I Brother    Breast cancer Neg Hx    Thrombosis Neg Hx     Social History Social History   Tobacco Use   Smoking status: Never   Smokeless tobacco: Never  Vaping Use   Vaping Use: Never used  Substance Use Topics   Alcohol use: Not Currently    Comment: 3-4x/yr   Drug use: No     Allergies   Apple juice   Review of Systems Review of Systems  Constitutional:  Negative for chills, diaphoresis, fatigue and fever.  HENT:  Positive for congestion. Negative for ear pain, rhinorrhea, sinus pressure, sinus pain and sore throat.   Respiratory:  Positive for cough. Negative for shortness of breath.   Gastrointestinal:  Negative for abdominal pain, nausea and vomiting.  Musculoskeletal:  Negative for arthralgias and myalgias.  Skin:  Negative for rash.  Neurological:  Negative for weakness and headaches.  Hematological:  Negative for adenopathy.     Physical Exam Triage Vital Signs ED Triage Vitals  Enc Vitals Group     BP      Pulse      Resp      Temp      Temp src      SpO2      Weight      Height      Head Circumference      Peak Flow      Pain Score      Pain Loc      Pain Edu?      Excl. in [place]?    No data found.  Updated Vital Signs BP 108/69 (BP Location: Left Arm)   Pulse 80   Temp 99.2 F (37.3 C) (Oral)   Resp 18   LMP  (LMP Unknown)   SpO2 100%      Physical Exam Vitals and nursing note reviewed.  Constitutional:      General: She is not in acute distress.    Appearance: Normal appearance. She is not ill-appearing or toxic-appearing.  HENT:     Head: Normocephalic and atraumatic.     Nose: Nose  normal.     Mouth/Throat:     Mouth: Mucous membranes are moist.     Pharynx: Oropharynx is clear.  Eyes:     General: No scleral icterus.       Right eye: No discharge.        Left eye: No discharge.     Conjunctiva/sclera: Conjunctivae normal.  Cardiovascular:     Rate and Rhythm: Normal rate and regular rhythm.     Heart sounds: Normal heart sounds.  Pulmonary:     Effort: Pulmonary effort is normal. No respiratory distress.     Breath sounds: Normal breath sounds.  Musculoskeletal:     Cervical back: Neck supple.  Skin:    General: Skin is dry.  Neurological:     General: No focal deficit present.     Mental Status: She is alert. Mental status is at baseline.     Motor: No weakness.     Gait: Gait normal.  Psychiatric:        Mood and Affect: Mood normal.        Behavior: Behavior normal.        Thought Content: Thought content normal.      UC Treatments / Results  Labs (all labs ordered are listed, but only abnormal results are displayed) Labs Reviewed  RESP PANEL BY RT-PCR (RSV, FLU A&B, COVID)  [id number]    EKG   Radiology No results found.  Procedures Procedures (including critical care time)  Medications Ordered in UC Medications - No data to display  Initial Impression / Assessment and Plan / UC Course  I have reviewed the triage vital signs and the nursing notes.  Pertinent labs & imaging results that were available during my care of the patient were reviewed by me and considered in my medical decision making (see chart for details).   27 year old female for is for feeling ill for the past week and a half.  She did have nasal congestion and sinus pressure but those symptoms resolved.  Now she has had a dry cough for the past 2 days.  She has been around family members with similar symptoms.  Patient says she thinks she got over what ever she was sick with a week and a half ago and picked up something new.  She would like a COVID test.  Vitals are  stable and normal.  She is overall well-appearing and in no acute distress.  Normal HEENT exam.  Chest clear auscultation.  Respiratory panel performed at patient request.  All negative.  Discussed results with patient.  Patient reports improvement of her symptoms.  Improving viral URI.  Supportive care with use of OTC cough medications, rest and fluids.  Reviewed return if uncontrolled fever, weakness or shortness of breath.  Final Clinical Impressions(s) / UC Diagnoses   Final diagnoses:  Viral URI with cough     Discharge Instructions      URI/COLD SYMPTOMS: Your exam today is consistent with a viral illness. Antibiotics are not indicated at this time. Use medications as directed, including cough syrup, nasal saline, and decongestants. Your symptoms should improve over the next few days and resolve within 7-10 days. Increase rest and fluids. F/u if symptoms worsen or predominate such as sore throat, ear pain, productive cough, shortness of breath, or if you develop high fevers or worsening fatigue over the next several days.       ED Prescriptions     Medication Sig Dispense Auth. Provider   promethazine-dextromethorphan (PROMETHAZINE-DM) 6.25-15 MG/5ML syrup Take 5 mLs by mouth 4 (four) times daily as needed. 118 mL Danton Clap, PA-C      PDMP not reviewed this encounter.   Danton Clap, PA-C 01/22/23 1946

## 2023-01-22 NOTE — Discharge Instructions (Signed)
URI/COLD SYMPTOMS: Your exam today is consistent with a viral illness. Antibiotics are not indicated at this time. Use medications as directed, including cough syrup, nasal saline, and decongestants. Your symptoms should improve over the next few days and resolve within 7-10 days. Increase rest and fluids. F/u if symptoms worsen or predominate such as sore throat, ear pain, productive cough, shortness of breath, or if you develop high fevers or worsening fatigue over the next several days.    

## 2023-01-22 NOTE — ED Triage Notes (Signed)
Pt presents with a cough x 2 days.  

## 2023-02-09 ENCOUNTER — Ambulatory Visit
Admission: EM | Admit: 2023-02-09 | Discharge: 2023-02-09 | Disposition: A | Discharge status: Home / Self Care | Payer: Medicaid Other | Attending: Family Medicine | Admitting: Family Medicine

## 2023-02-09 DIAGNOSIS — J020 Streptococcal pharyngitis: Secondary | ICD-10-CM | POA: Diagnosis present

## 2023-02-09 LAB — GROUP A STREP BY PCR: Group A Strep by PCR: DETECTED — AB

## 2023-02-09 MED ORDER — AMOXICILLIN 500 MG PO TABS: 1000.0000 mg | ORAL_TABLET | Freq: Every day | ORAL | 0 refills | Status: AC

## 2023-02-09 NOTE — Discharge Instructions (Signed)
You have strep throat. Stop by the pharmacy to pick up your prescriptions.  Follow up with your primary care provider as needed.

## 2023-02-09 NOTE — ED Provider Notes (Signed)
[facility]    CSN: [id number] Arrival date & time: 02/09/23  1139      History   Chief Complaint Chief Complaint  Patient presents with   Sore Throat   Headache        Neck Pain    HPI Sheryl Harrison is a 27 y.o. female.   HPI   Sheryl Harrison presents for sore throat since Friday morning. No known sick contact. She works at a school. Endorses chills, neck pain, headache. No abdominal pain, rhinorrhea or congestion. There has been no cough. She has taken [facility] powder, allergy medications without relief. She woke up with worse pain today.      History reviewed. No pertinent past medical history.  Patient Active Problem List   Diagnosis Date Noted   Obesity, unspecified 03/16/2019   Allergic rhinitis 03/11/2012   Chronic headaches 03/11/2012    Past Surgical History:  Procedure Laterality Date   DENTAL SURGERY     WISDOM TOOTH EXTRACTION  2019    OB History     Gravida  1   Para  0   Term  0   Preterm  0   AB  1   Living  0      SAB  0   IAB  1   Ectopic  0   Multiple  0   Live Births  0            Home Medications    Prior to Admission medications   Medication Sig Start Date End Date Taking? Authorizing Provider  fluticasone (FLONASE) 50 MCG/ACT nasal spray Place 2 sprays into both nostrils daily. Patient not taking: Reported on 08/01/2020 01/01/17   Hagler, Jami L, PA-C  naproxen (NAPROSYN) 500 MG tablet Take 1 tablet (500 mg total) by mouth 2 (two) times daily with a meal. 12/29/20   Sable Feil, PA-C  norethindrone-ethinyl estradiol (MICROGESTIN FE 1/20) 1-20 MG-MCG tablet Take 1 tablet by mouth daily. Patient not taking: Reported on 08/01/2020 05/11/20   Charlotte Sanes L, PA-C  promethazine-dextromethorphan (PROMETHAZINE-DM) 6.25-15 MG/5ML syrup Take 5 mLs by mouth 4 (four) times daily as needed. 01/22/23   Danton Clap, PA-C    Family History Family History  Problem Relation Age of Onset   Fibromyalgia Mother    Cerebral  palsy Sister    Heart attack Sister    Diabetes type I Brother    Breast cancer Neg Hx    Thrombosis Neg Hx     Social History Social History   Tobacco Use   Smoking status: Never   Smokeless tobacco: Never  Vaping Use   Vaping Use: Never used  Substance Use Topics   Alcohol use: Not Currently    Comment: 3-4x/yr   Drug use: No     Allergies   Apple juice   Review of Systems Review of Systems: negative unless otherwise stated in HPI.      Physical Exam Triage Vital Signs ED Triage Vitals  Enc Vitals Group     BP 02/09/23 1240 112/75     Pulse Rate 02/09/23 1240 78     Resp 02/09/23 1240 16     Temp 02/09/23 1240 99.2 F (37.3 C)     Temp Source 02/09/23 1240 Oral     SpO2 02/09/23 1240 94 %     Weight 02/09/23 1239 175 lb (79.4 kg)     Height 02/09/23 1239 5' 2"$  (1.575 m)     Head Circumference --  Peak Flow --      Pain Score 02/09/23 1239 7     Pain Loc --      Pain Edu? --      Excl. in [place]? --    No data found.  Updated Vital Signs BP 112/75 (BP Location: Left Arm)   Pulse 78   Temp 99.2 F (37.3 C) (Oral)   Resp 16   Ht 5' 2"$  (1.575 m)   Wt 79.4 kg   LMP  (LMP Unknown)   SpO2 94%   BMI 32.01 kg/m   Visual Acuity Right Eye Distance:   Left Eye Distance:   Bilateral Distance:    Right Eye Near:   Left Eye Near:    Bilateral Near:     Physical Exam GEN:     alert, non-toxic appearing female in no distress ***   HENT:  mucus membranes moist, oropharyngeal ***without lesions or ***exudate, no*** tonsillar hypertrophy, *** mild oropharyngeal erythema , *** moderate erythematous edematous turbinates, ***clear nasal discharge, ***bilateral TM normal EYES:   pupils equal and reactive, ***no scleral injection or discharge NECK:  normal ROM, no ***lymphadenopathy, ***no meningismus   RESP:  no increased work of breathing, ***clear to auscultation bilaterally CVS:   regular rate ***and rhythm Skin:   warm and dry, no rash on visible  skin***    UC Treatments / Results  Labs (all labs ordered are listed, but only abnormal results are displayed) Labs Reviewed  GROUP A STREP BY PCR - Abnormal; Notable for the following components:      Result Value   Group A Strep by PCR DETECTED (*)    All other components within normal limits    EKG   Radiology No results found.  Procedures Procedures (including critical care time)  Medications Ordered in UC Medications - No data to display  Initial Impression / Assessment and Plan / UC Course  I have reviewed the triage vital signs and the nursing notes.  Pertinent labs & imaging results that were available during my care of the patient were reviewed by me and considered in my medical decision making (see chart for details).       Strep pharyngitis Patient is a 27 y.o. female who presents for sore throat for the past ***.  On exam, pharyngeal exam is ***erythematous but ***no exudates.  He does have some ***tonsillar hypertrophy. Strep test is positive. Overall patient is well-appearing, well-hydrated and without respiratory distress. ***He is afebrile here.  Tylenol/Motrin as needed for discomfort.  Continue gargling with warm salt water.  Recommended to avoid anything that irritates his*** throat.  Treat with amoxicillin for 10 days.  Work/school *** note provided, per request.    Discussed MDM, treatment plan and plan for follow-up with patient/parent who agrees with plan.      Final Clinical Impressions(s) / UC Diagnoses   Final diagnoses:  None   Discharge Instructions   None    ED Prescriptions   None    PDMP not reviewed this encounter.

## 2023-02-09 NOTE — ED Triage Notes (Signed)
Pt c/o sore throat,HA & neck pain x3, sx's getting progressively worse.

## 2023-04-21 ENCOUNTER — Ambulatory Visit
Admission: EM | Admit: 2023-04-21 | Discharge: 2023-04-21 | Disposition: A | Discharge status: Home / Self Care | Payer: Medicaid Other

## 2023-04-21 ENCOUNTER — Encounter: Payer: Self-pay | Admitting: Emergency Medicine

## 2023-04-21 DIAGNOSIS — G500 Trigeminal neuralgia: Secondary | ICD-10-CM | POA: Diagnosis not present

## 2023-04-21 MED ORDER — BACLOFEN 5 MG PO TABS: 5.0000 mg | ORAL_TABLET | Freq: Two times a day (BID) | ORAL | 0 refills | Status: DC | PRN

## 2023-04-21 NOTE — Discharge Instructions (Signed)
Trial of baclofen twice daily.  Please of this medication make you drowsy.  Do not drink alcohol or drive on this medication Follow-up with your dentist to be fitted for a mouthguard to help alleviate teeth grinding Please establish with a PCP ASAP for further evaluation and treatment options of trigeminal neuralgia if the baclofen is not effective for you Please go to the ER for any worsening symptoms

## 2023-04-21 NOTE — ED Triage Notes (Signed)
Pt states for the past 4 days she has had a shocking or stabbing pain to the left side of her face.

## 2023-04-21 NOTE — ED Provider Notes (Signed)
[facility]    CSN: [id number] Arrival date & time: 04/21/23  4098      History   Chief Complaint Chief Complaint  Patient presents with   Facial Pain    HPI Sheryl Harrison is a 27 y.o. female Sheryl Harrison for evaluation of facial pain.  Patient reports 4 days of an intermittent left-sided jaw/facial pain that she describes as a shocking/stabbing type pain that is brought on by holding her jaw certain way, rubbing her lips or her face, chewing.  Denies any known injury.  No recent illnesses.  No tooth pain or recent dental work.  She does states she grinds her teeth at night but does not currently wear a mouthguard.  She has in the past.  Denies any TMJ pain.  She has not taken any OTC medications.  No other concerns at this time.  HPI  History reviewed. No pertinent past medical history.  Patient Active Problem List   Diagnosis Date Noted   Obesity, unspecified 03/16/2019   Allergic rhinitis 03/11/2012   Chronic headaches 03/11/2012    Past Surgical History:  Procedure Laterality Date   DENTAL SURGERY     WISDOM TOOTH EXTRACTION  2019    OB History     Gravida  1   Para  0   Term  0   Preterm  0   AB  1   Living  0      SAB  0   IAB  1   Ectopic  0   Multiple  0   Live Births  0            Home Medications    Prior to Admission medications   Medication Sig Start Date End Date Taking? Authorizing Provider  Baclofen 5 MG TABS Take 1 tablet (5 mg total) by mouth 2 (two) times daily as needed. 04/21/23  Yes Radford Pax, NP  SPRINTEC 28 0.25-35 MG-MCG tablet Take 1 tablet by mouth daily. 02/27/23  Yes [provider]  fluticasone (FLONASE) 50 MCG/ACT nasal spray Place 2 sprays into both nostrils daily. Patient not taking: Reported on 08/01/2020 01/01/17   Hagler, Jami L, PA-C  naproxen (NAPROSYN) 500 MG tablet Take 1 tablet (500 mg total) by mouth 2 (two) times daily with a meal. 12/29/20   Joni Reining, PA-C  norethindrone-ethinyl  estradiol (MICROGESTIN FE 1/20) 1-20 MG-MCG tablet Take 1 tablet by mouth daily. Patient not taking: Reported on 08/01/2020 05/11/20   Samara Snide L, PA-C  promethazine-dextromethorphan (PROMETHAZINE-DM) 6.25-15 MG/5ML syrup Take 5 mLs by mouth 4 (four) times daily as needed. 01/22/23   Shirlee Latch, PA-C    Family History Family History  Problem Relation Age of Onset   Fibromyalgia Mother    Cerebral palsy Sister    Heart attack Sister    Diabetes type I Brother    Breast cancer Neg Hx    Thrombosis Neg Hx     Social History Social History   Tobacco Use   Smoking status: Never   Smokeless tobacco: Never  Vaping Use   Vaping Use: Never used  Substance Use Topics   Alcohol use: Not Currently    Comment: 3-4x/yr   Drug use: No     Allergies   Apple juice   Review of Systems Review of Systems  Musculoskeletal:        Jaw pain      Physical Exam Triage Vital Signs ED Triage Vitals  Enc Vitals  Group     BP 04/21/23 1008 122/78     Pulse Rate 04/21/23 1008 62     Resp 04/21/23 1008 16     Temp 04/21/23 1008 98.1 F (36.7 C)     Temp Source 04/21/23 1008 Oral     SpO2 04/21/23 1008 100 %     Weight --      Height --      Head Circumference --      Peak Flow --      Pain Score 04/21/23 1010 7     Pain Loc --      Pain Edu? --      Excl. in [place]? --    No data found.  Updated Vital Signs BP 122/78   Pulse 62   Temp 98.1 F (36.7 C) (Oral)   Resp 16   LMP  (LMP Unknown)   SpO2 100%   Visual Acuity Right Eye Distance:   Left Eye Distance:   Bilateral Distance:    Right Eye Near:   Left Eye Near:    Bilateral Near:     Physical Exam Vitals and nursing note reviewed.  Constitutional:      General: She is not in acute distress.    Appearance: Normal appearance. She is not ill-appearing.  HENT:     Head: Normocephalic and atraumatic.     Jaw: No trismus or swelling.      Comments: Swelling, erythema, ecchymosis of the left jaw.  Pain  triggered by light touch/pressure    Mouth/Throat:     Lips: Pink.     Mouth: Mucous membranes are moist. No injury, lacerations, oral lesions or angioedema.     Dentition: No dental tenderness, gingival swelling, dental caries, dental abscesses or gum lesions.     Pharynx: Oropharynx is clear. Uvula midline. No uvula swelling.  Eyes:     Pupils: Pupils are equal, round, and reactive to light.  Cardiovascular:     Rate and Rhythm: Normal rate.  Pulmonary:     Effort: Pulmonary effort is normal.  Skin:    General: Skin is warm and dry.  Neurological:     General: No focal deficit present.     Mental Status: She is alert and oriented to person, place, and time.  Psychiatric:        Mood and Affect: Mood normal.        Behavior: Behavior normal.      UC Treatments / Results  Labs (all labs ordered are listed, but only abnormal results are displayed) Labs Reviewed - No data to display  EKG   Radiology No results found.  Procedures Procedures (including critical care time)  Medications Ordered in UC Medications - No data to display  Initial Impression / Assessment and Plan / UC Course  I have reviewed the triage vital signs and the nursing notes.  Pertinent labs & imaging results that were available during my care of the patient were reviewed by me and considered in my medical decision making (see chart for details).     Reviewed exam and symptoms with patient.  Discussed symptoms and exam consistent with trigeminal neuralgia.  Advised that she follow-up with her dentist for mouthguard to prevent grinding as this may be aggravating it Discussed treatment options for this.  Will restart baclofen as this has the least side effect profile of treatments.  Patient does not have a PCP currently.  Strongly encouraged her to establish with a PCP for further  evaluation and treatment options if she does not respond to baclofen and she verbalized understanding Strict ER precaution  reviewed and patient verbalized understanding Final Clinical Impressions(s) / UC Diagnoses   Final diagnoses:  Trigeminal neuralgia     Discharge Instructions      Trial of baclofen twice daily.  Please of this medication make you drowsy.  Do not drink alcohol or drive on this medication Follow-up with your dentist to be fitted for a mouthguard to help alleviate teeth grinding Please establish with a PCP ASAP for further evaluation and treatment options of trigeminal neuralgia if the baclofen is not effective for you Please go to the ER for any worsening symptoms    ED Prescriptions     Medication Sig Dispense Auth. Provider   Baclofen 5 MG TABS Take 1 tablet (5 mg total) by mouth 2 (two) times daily as needed. 25 tablet Radford Pax, NP      PDMP not reviewed this encounter.   Radford Pax, NP 04/21/23 [phone]

## 2023-05-01 ENCOUNTER — Ambulatory Visit
Admission: EM | Admit: 2023-05-01 | Discharge: 2023-05-01 | Disposition: A | Discharge status: Home / Self Care | Payer: Medicaid Other | Attending: Physician Assistant | Admitting: Physician Assistant

## 2023-05-01 DIAGNOSIS — G500 Trigeminal neuralgia: Secondary | ICD-10-CM | POA: Insufficient documentation

## 2023-05-01 LAB — PREGNANCY, URINE: Preg Test, Ur: NEGATIVE

## 2023-05-01 MED ORDER — GABAPENTIN 300 MG PO CAPS: ORAL_CAPSULE | ORAL | 0 refills | Status: AC

## 2023-05-01 MED ORDER — HYDROCODONE-ACETAMINOPHEN 5-325 MG PO TABS: 1.0000 | ORAL_TABLET | Freq: Four times a day (QID) | ORAL | 0 refills | Status: AC | PRN

## 2023-05-01 NOTE — ED Provider Notes (Signed)
[facility]    CSN: [id number] Arrival date & time: 05/01/23  1548      History   Chief Complaint No chief complaint on file.   HPI Sheryl Harrison is a 27 y.o. female presenting for left-sided facial, jaw and tongue pain that she has been experiencing for the past 3 weeks.  Patient reports symptoms are usually triggered by chewing, sometimes opening her mouth and sometimes moving her tongue or brushing her tongue when she brushes her teeth.  No dental pain at all.  Denies throat pain, headache.  Pain is sharp and stabbing.  Pain keeps her from sleeping at night sometimes.  Patient seen in this department 04/21/23 and diagnosed with trigeminal neuralgia based on clinical presentation. Treated with Baclofen. Patient has never received this diagnosis before, but does feel that it matches her presentation.  She says the baclofen did not help and ibuprofen and Tylenol did not help either.  She believes her symptoms have just gotten worse.  She does have an appoint with a new PCP in about a week but would like to try something different, potentially carbamazepine for her symptoms.  She denies any severe headaches, fatigue, vision changes, nausea/vomiting, numbness/tingling or weakness of face or extremities.  No previous history of any neurological problems including MS.  HPI  History reviewed. No pertinent past medical history.  Patient Active Problem List   Diagnosis Date Noted   Obesity, unspecified 03/16/2019   Allergic rhinitis 03/11/2012   Chronic headaches 03/11/2012    Past Surgical History:  Procedure Laterality Date   DENTAL SURGERY     WISDOM TOOTH EXTRACTION  2019    OB History     Gravida  1   Para  0   Term  0   Preterm  0   AB  1   Living  0      SAB  0   IAB  1   Ectopic  0   Multiple  0   Live Births  0            Home Medications    Prior to Admission medications   Medication Sig Start Date End Date Taking? Authorizing  Provider  gabapentin (NEURONTIN) 300 MG capsule Start 1 capsule PO at bed time x 1 day then increase to 1 capsule PO BID x 1 day, then 1 capsule PO TID if tolerating 05/01/23  Yes Shirlee Latch, PA-C  HYDROcodone-acetaminophen (NORCO/VICODIN) 5-325 MG tablet Take 1 tablet by mouth every 6 (six) hours as needed for up to 3 days for severe pain. 05/01/23 05/04/23 Yes Eusebio Friendly B, PA-C  fluticasone (FLONASE) 50 MCG/ACT nasal spray Place 2 sprays into both nostrils daily. Patient not taking: Reported on 08/01/2020 01/01/17   Hagler, Jami L, PA-C  norethindrone-ethinyl estradiol (MICROGESTIN FE 1/20) 1-20 MG-MCG tablet Take 1 tablet by mouth daily. Patient not taking: Reported on 08/01/2020 05/11/20   Ann Held, PA-C  SPRINTEC 28 0.25-35 MG-MCG tablet Take 1 tablet by mouth daily. 02/27/23   [provider]    Family History Family History  Problem Relation Age of Onset   Fibromyalgia Mother    Cerebral palsy Sister    Heart attack Sister    Diabetes type I Brother    Breast cancer Neg Hx    Thrombosis Neg Hx     Social History Social History   Tobacco Use   Smoking status: Never   Smokeless tobacco: Never  Vaping Use  Vaping Use: Never used  Substance Use Topics   Alcohol use: Not Currently    Comment: 3-4x/yr   Drug use: No     Allergies   Apple juice   Review of Systems Review of Systems  Constitutional:  Negative for fatigue and fever.  HENT:  Negative for congestion, dental problem, ear discharge, ear pain, facial swelling, hearing loss, mouth sores, rhinorrhea and sore throat.        +facial, tongue and jaw pain on the left side  Eyes:  Negative for photophobia and visual disturbance.  Respiratory:  Negative for cough.   Musculoskeletal:  Negative for neck pain.  Skin:  Negative for color change and rash.  Neurological:  Positive for dizziness. Negative for syncope, facial asymmetry, weakness, numbness and headaches.     Physical Exam Triage Vital  Signs ED Triage Vitals  Enc Vitals Group     BP      Pulse      Resp      Temp      Temp src      SpO2      Weight      Height      Head Circumference      Peak Flow      Pain Score      Pain Loc      Pain Edu?      Excl. in [place]?    No data found.  Updated Vital Signs BP 106/67 (BP Location: Left Arm)   Pulse 82   Temp 98.9 F (37.2 C) (Oral)   Resp 18   LMP 04/27/2023 (Approximate)   SpO2 98%   Physical Exam Vitals and nursing note reviewed.  Constitutional:      General: She is not in acute distress.    Appearance: Normal appearance. She is not ill-appearing or toxic-appearing.  HENT:     Head: Normocephalic and atraumatic.     Right Ear: Tympanic membrane, ear canal and external ear normal.     Left Ear: Tympanic membrane, ear canal and external ear normal.     Nose: Nose normal.     Mouth/Throat:     Mouth: Mucous membranes are moist.     Pharynx: Oropharynx is clear.  Eyes:     General: No scleral icterus.       Right eye: No discharge.        Left eye: No discharge.     Extraocular Movements: Extraocular movements intact.     Conjunctiva/sclera: Conjunctivae normal.     Pupils: Pupils are equal, round, and reactive to light.  Cardiovascular:     Rate and Rhythm: Normal rate and regular rhythm.     Heart sounds: Normal heart sounds.  Pulmonary:     Effort: Pulmonary effort is normal. No respiratory distress.     Breath sounds: Normal breath sounds.  Musculoskeletal:     Cervical back: Neck supple.  Skin:    General: Skin is dry.  Neurological:     General: No focal deficit present.     Mental Status: She is alert and oriented to person, place, and time. Mental status is at baseline.     Cranial Nerves: No cranial nerve deficit.     Motor: No weakness.     Gait: Gait normal.     Comments: No obvious cranial nerve deficits.  Left anterior two thirds of tongue appears sensitive when touched.  Left cheek also appears sensitive when palpated.  No  tenderness of  TMJ and no clicking, locking or popping.  No significant pain when opening mouth.  Psychiatric:        Mood and Affect: Mood normal.        Behavior: Behavior normal.        Thought Content: Thought content normal.      UC Treatments / Results  Labs (all labs ordered are listed, but only abnormal results are displayed) Labs Reviewed  PREGNANCY, URINE    EKG   Radiology No results found.  Procedures Procedures (including critical care time)  Medications Ordered in UC Medications - No data to display  Initial Impression / Assessment and Plan / UC Course  I have reviewed the triage vital signs and the nursing notes.  Pertinent labs & imaging results that were available during my care of the patient were reviewed by me and considered in my medical decision making (see chart for details).   27 year old female presents for 3-week history of left-sided facial, jaw and tongue pain.  Symptoms are triggered by chewing, brushing her tongue.  Symptoms not improved by Tylenol, ibuprofen or baclofen.  Received diagnosis of potential trigeminal neuralgia at previous visit to this urgent care a couple weeks ago.  Has an appoint with new PCP in a week.  Vitals are normal and stable patient is overall well-appearing.  Normal neuroexam.  Normal HEENT exam.  She appears to have tenderness when her left cheek is lightly touched and when her left anterior two thirds of the tongue is slightly touch.  No dental infection noted.  Throat is clear.  No tenderness of TMJ.  CBC, CMP and urine pregnancy ordered.  Pregnancy test was negative.  Unfortunately multiple attempts to obtain blood draw were unsuccessful.  Patient did not want to continue to try to get blood.  Advised that was understandable.  I will hold off on prescribing carbamazepine or oxcarbazepine without obtaining baseline labs.  We discussed trying gabapentin at this time and Norco as needed for severe/breakthrough pain.   Patient is agreeable.  She has an appointment with new PCP on 05/09/2023.  Advised to keep that appointment.  I suspect the may order imaging and/or refer her to neurology.  If the gabapentin is not helping then I suspect labs may be obtained and one of the other medications attempted.  Patient is understanding and agreeable to plan.  ED precautions discussed.   Final Clinical Impressions(s) / UC Diagnoses   Final diagnoses:  Trigeminal neuralgia of left side of face     Discharge Instructions      -Urine pregnancy test was negative.  Unfortunately we were not able to obtain the lab work which was not work to start you on one of the medications carbamazepine or oxcarbazepine. - I have sent a prescription for gabapentin which should also be helpful to the pharmacy.  You will start by taking 1 capsule at bedtime for a day.  If tolerating this without any drowsiness increase it the following day to twice daily and then if still tolerating it well increase it on the third day to every 8 hours. - I sent Norco as needed for severe pain.  Can also take ibuprofen or naproxen. - Keep your appointment with your new PCP in 8 days.  I am sure they will perform further workup on you.  You may need to have a CT or MRI scan performed or referral to neurology. - If at any point your pain worsens you should go to the ER.  ED Prescriptions     Medication Sig Dispense Auth. Provider   gabapentin (NEURONTIN) 300 MG capsule Start 1 capsule PO at bed time x 1 day then increase to 1 capsule PO BID x 1 day, then 1 capsule PO TID if tolerating 30 capsule Eusebio Friendly B, PA-C   HYDROcodone-acetaminophen (NORCO/VICODIN) 5-325 MG tablet Take 1 tablet by mouth every 6 (six) hours as needed for up to 3 days for severe pain. 10 tablet Shirlee Latch, PA-C      I have reviewed the PDMP during this encounter.   Shirlee Latch, PA-C 05/01/23 1719

## 2023-05-01 NOTE — ED Triage Notes (Signed)
Pt reports she still has left side nerve pain on the left side of her face  since the last time she was here 04/21/23. She states If she brushes her tongue it creates excruciating pain.   States meds didn't help or stop the pain in fact the pain has increased.

## 2023-05-01 NOTE — Discharge Instructions (Addendum)
-  Urine pregnancy test was negative.  Unfortunately we were not able to obtain the lab work which was not work to start you on one of the medications carbamazepine or oxcarbazepine. - I have sent a prescription for gabapentin which should also be helpful to the pharmacy.  You will start by taking 1 capsule at bedtime for a day.  If tolerating this without any drowsiness increase it the following day to twice daily and then if still tolerating it well increase it on the third day to every 8 hours. - I sent Norco as needed for severe pain.  Can also take ibuprofen or naproxen. - Keep your appointment with your new PCP in 8 days.  I am sure they will perform further workup on you.  You may need to have a CT or MRI scan performed or referral to neurology. - If at any point your pain worsens you should go to the ER.

## 2024-04-02 ENCOUNTER — Ambulatory Visit
Admission: EM | Admit: 2024-04-02 | Discharge: 2024-04-02 | Disposition: A | Discharge status: Home / Self Care | Attending: Family Medicine | Admitting: Family Medicine

## 2024-04-02 ENCOUNTER — Encounter: Payer: Self-pay | Admitting: Emergency Medicine

## 2024-04-02 DIAGNOSIS — R197 Diarrhea, unspecified: Secondary | ICD-10-CM | POA: Diagnosis present

## 2024-04-02 DIAGNOSIS — J301 Allergic rhinitis due to pollen: Secondary | ICD-10-CM | POA: Insufficient documentation

## 2024-04-02 DIAGNOSIS — H1013 Acute atopic conjunctivitis, bilateral: Secondary | ICD-10-CM | POA: Diagnosis present

## 2024-04-02 DIAGNOSIS — J302 Other seasonal allergic rhinitis: Secondary | ICD-10-CM | POA: Insufficient documentation

## 2024-04-02 LAB — RESP PANEL BY RT-PCR (FLU A&B, COVID) ARPGX2
Influenza A by PCR: NEGATIVE
Influenza B by PCR: NEGATIVE
SARS Coronavirus 2 by RT PCR: NEGATIVE

## 2024-04-02 MED ORDER — AZELASTINE HCL 0.05 % OP SOLN: 1.0000 [drp] | Freq: Two times a day (BID) | OPHTHALMIC | 0 refills | Status: AC | PRN

## 2024-04-02 MED ORDER — FLUTICASONE PROPIONATE 50 MCG/ACT NA SUSP: 1.0000 | Freq: Every day | NASAL | 0 refills | Status: AC

## 2024-04-02 NOTE — ED Provider Notes (Signed)
 [facility]    CSN: [id number] Arrival date & time: 04/02/24  8416      History   Chief Complaint Chief Complaint  Patient presents with   Diarrhea   Nausea    HPI Sheryl Harrison is a 28 y.o. female  presents for evaluation of URI symptoms for 2 days. Patient reports associated symptoms of nonbloody diarrhea with decreased appetite.  Also endorses allergy symptoms with runny nose, congestion, watery itchy eyes.  Denies nausea, vomiting, sore throat, cough, ear pain, fevers, body aches, shortness of breath. Patient does not have a hx of asthma. Patient is not an active smoker.   Reports household members had similar symptoms regarding diarrhea.  Pt has taken allergy medicine and other over-the-counter allergy eyedrops OTC for symptoms.  Ranking fluids normally.  No dysuria.  Pt has no other concerns at this time.    Diarrhea   History reviewed. No pertinent past medical history.  Patient Active Problem List   Diagnosis Date Noted   Obesity, unspecified 03/16/2019   Allergic rhinitis 03/11/2012   Chronic headaches 03/11/2012    Past Surgical History:  Procedure Laterality Date   DENTAL SURGERY     WISDOM TOOTH EXTRACTION  2019    OB History     Gravida  1   Para  0   Term  0   Preterm  0   AB  1   Living  0      SAB  0   IAB  1   Ectopic  0   Multiple  0   Live Births  0            Home Medications    Prior to Admission medications   Medication Sig Start Date End Date Taking? Authorizing Provider  azelastine (OPTIVAR) 0.05 % ophthalmic solution Place 1 drop into both eyes 2 (two) times daily as needed (allergy symptoms). 04/02/24  Yes Radford Pax, NP  cetirizine (ZYRTEC) 10 MG tablet Take 10 mg by mouth daily.   Yes [provider]  fluticasone (FLONASE) 50 MCG/ACT nasal spray Place 1 spray into both nostrils daily. 04/02/24  Yes Radford Pax, NP  SPRINTEC 28 0.25-35 MG-MCG tablet Take 1 tablet by mouth daily. 02/27/23   Yes [provider]  gabapentin (NEURONTIN) 300 MG capsule Start 1 capsule PO at bed time x 1 day then increase to 1 capsule PO BID x 1 day, then 1 capsule PO TID if tolerating 05/01/23   Eusebio Friendly B, PA-C  norethindrone-ethinyl estradiol (MICROGESTIN FE 1/20) 1-20 MG-MCG tablet Take 1 tablet by mouth daily. Patient not taking: Reported on 08/01/2020 05/11/20   Ann Held, PA-C    Family History Family History  Problem Relation Age of Onset   Fibromyalgia Mother    Cerebral palsy Sister    Heart attack Sister    Diabetes type I Brother    Breast cancer Neg Hx    Thrombosis Neg Hx     Social History Social History   Tobacco Use   Smoking status: Never   Smokeless tobacco: Never  Vaping Use   Vaping status: Never Used  Substance Use Topics   Alcohol use: Not Currently    Comment: 3-4x/yr   Drug use: No     Allergies   Apple juice   Review of Systems Review of Systems  HENT:  Positive for congestion, postnasal drip and sneezing.   Eyes:  Positive for itching.  Gastrointestinal:  Positive  for diarrhea.     Physical Exam Triage Vital Signs ED Triage Vitals  Encounter Vitals Group     BP 04/02/24 1015 111/72     Systolic BP Percentile --      Diastolic BP Percentile --      Pulse Rate 04/02/24 1015 72     Resp 04/02/24 1015 16     Temp 04/02/24 1015 98.2 F (36.8 C)     Temp Source 04/02/24 1015 Oral     SpO2 04/02/24 1015 98 %     Weight 04/02/24 1013 175 lb 0.7 oz (79.4 kg)     Height 04/02/24 1013 5\' 2"  (1.575 m)     Head Circumference --      Peak Flow --      Pain Score 04/02/24 1013 6     Pain Loc --      Pain Education --      Exclude from Growth Chart --    No data found.  Updated Vital Signs BP 111/72 (BP Location: Right Arm)   Pulse 72   Temp 98.2 F (36.8 C) (Oral)   Resp 16   Ht 5\' 2"  (1.575 m)   Wt 175 lb 0.7 oz (79.4 kg)   SpO2 98%   BMI 32.02 kg/m   Visual Acuity Right Eye Distance:   Left Eye Distance:    Bilateral Distance:    Right Eye Near:   Left Eye Near:    Bilateral Near:     Physical Exam Vitals and nursing note reviewed.  Constitutional:      General: She is not in acute distress.    Appearance: She is well-developed. She is not ill-appearing.  HENT:     Head: Normocephalic and atraumatic.     Right Ear: Tympanic membrane and ear canal normal.     Left Ear: Tympanic membrane and ear canal normal.     Nose: Rhinorrhea present. No congestion.     Mouth/Throat:     Mouth: Mucous membranes are moist.     Pharynx: Oropharynx is clear. Uvula midline. No oropharyngeal exudate or posterior oropharyngeal erythema.     Tonsils: No tonsillar exudate or tonsillar abscesses.  Eyes:     General: Lids are normal.     Conjunctiva/sclera:     Right eye: Right conjunctiva is not injected. No chemosis, exudate or hemorrhage.    Left eye: Left conjunctiva is not injected. No chemosis, exudate or hemorrhage.    Pupils: Pupils are equal, round, and reactive to light.  Cardiovascular:     Rate and Rhythm: Normal rate and regular rhythm.     Heart sounds: Normal heart sounds.  Pulmonary:     Effort: Pulmonary effort is normal.     Breath sounds: Normal breath sounds.  Abdominal:     General: Bowel sounds are normal. There is no distension.     Palpations: Abdomen is soft.     Tenderness: There is no abdominal tenderness. There is no guarding.  Musculoskeletal:     Cervical back: Normal range of motion and neck supple.  Lymphadenopathy:     Cervical: No cervical adenopathy.  Skin:    General: Skin is warm and dry.  Neurological:     General: No focal deficit present.     Mental Status: She is alert and oriented to person, place, and time.  Psychiatric:        Mood and Affect: Mood normal.        Behavior: Behavior normal.  UC Treatments / Results  Labs (all labs ordered are listed, but only abnormal results are displayed) Labs Reviewed  RESP PANEL BY RT-PCR (FLU A&B,  COVID) [id number]    EKG   Radiology No results found.  Procedures Procedures (including critical care time)  Medications Ordered in UC Medications - No data to display  Initial Impression / Assessment and Plan / UC Course  I have reviewed the triage vital signs and the nursing notes.  Pertinent labs & imaging results that were available during my care of the patient were reviewed by me and considered in my medical decision making (see chart for details).     Reviewed exam and symptoms with patient.  No red flags.  Negative flu and COVID PCR.  Discussed viral enteritis as well as seasonal allergies.  May use over-the-counter Imodium if needed.  Start Flonase and allergy eyedrops as prescribed.  Patient to continue over-the-counter allergy medicine daily.  Encourage rest fluids/electrolyte replacement.  PCP follow-up symptoms do not improve.  ER precautions reviewed. Final Clinical Impressions(s) / UC Diagnoses   Final diagnoses:  Seasonal allergies  Allergic conjunctivitis of both eyes  Seasonal allergic rhinitis due to pollen  Diarrhea, unspecified type     Discharge Instructions      Start Flonase daily.  You may use the allergy eyedrops twice daily as needed.  Continue over-the-counter allergy medicine such as Claritin or Zyrtec daily.  Focus on hydration/rest/electrolyte replacement with Gatorade, Powerade, Pedialyte, water.  Please follow-up with your PCP if your symptoms do not improve.  Please go to the ER for any worsening symptoms.  Hope you feel better soon!     ED Prescriptions     Medication Sig Dispense Auth. Provider   azelastine (OPTIVAR) 0.05 % ophthalmic solution Place 1 drop into both eyes 2 (two) times daily as needed (allergy symptoms). 6 mL Radford Pax, NP   fluticasone (FLONASE) 50 MCG/ACT nasal spray Place 1 spray into both nostrils daily. 15.8 mL Radford Pax, NP      PDMP not reviewed this encounter.   Radford Pax, NP 04/02/24 1110

## 2024-04-02 NOTE — ED Triage Notes (Signed)
 Pt c/o diarrhea, nausea, allergy symptoms. Started about 2 days ago. Denies fever. She states her entire household has had similar symptoms.

## 2024-04-02 NOTE — Discharge Instructions (Addendum)
 Start Flonase daily.  You may use the allergy eyedrops twice daily as needed.  Continue over-the-counter allergy medicine such as Claritin or Zyrtec daily.  Focus on hydration/rest/electrolyte replacement with Gatorade, Powerade, Pedialyte, water.  Please follow-up with your PCP if your symptoms do not improve.  Please go to the ER for any worsening symptoms.  Hope you feel better soon!

## 2024-04-14 ENCOUNTER — Ambulatory Visit
Admission: EM | Admit: 2024-04-14 | Discharge: 2024-04-14 | Disposition: A | Discharge status: Home / Self Care | Attending: Emergency Medicine | Admitting: Emergency Medicine

## 2024-04-14 DIAGNOSIS — L509 Urticaria, unspecified: Secondary | ICD-10-CM | POA: Diagnosis not present

## 2024-04-14 DIAGNOSIS — T7840XA Allergy, unspecified, initial encounter: Secondary | ICD-10-CM | POA: Diagnosis not present

## 2024-04-14 MED ORDER — FAMOTIDINE 20 MG PO TABS
20.0000 mg | ORAL_TABLET | Freq: Once | ORAL | Status: AC
  Administered 2024-04-14: 20 mg via ORAL

## 2024-04-14 MED ORDER — FEXOFENADINE HCL 180 MG PO TABS: 180.0000 mg | ORAL_TABLET | Freq: Every day | ORAL | 0 refills | Status: AC

## 2024-04-14 MED ORDER — FAMOTIDINE 20 MG PO TABS: 20.0000 mg | ORAL_TABLET | Freq: Two times a day (BID) | ORAL | 0 refills | Status: AC

## 2024-04-14 MED ORDER — PREDNISONE 10 MG (21) PO TBPK: ORAL_TABLET | ORAL | 0 refills | Status: AC

## 2024-04-14 MED ORDER — DIPHENHYDRAMINE HCL 25 MG PO TABS: 50.0000 mg | ORAL_TABLET | Freq: Every day | ORAL | 0 refills | Status: AC

## 2024-04-14 MED ORDER — DEXAMETHASONE SODIUM PHOSPHATE 10 MG/ML IJ SOLN
10.0000 mg | Freq: Once | INTRAMUSCULAR | Status: AC
  Administered 2024-04-14: 10 mg via INTRAMUSCULAR

## 2024-04-14 NOTE — ED Provider Notes (Signed)
 [facility]    CSN: [id number] Arrival date & time: 04/14/24  1220      History   Chief Complaint Chief Complaint  Patient presents with   Rash    HPI Sheryl Harrison is a 28 y.o. female.   HPI  28 year old female with past medical history difficult for allergic rhinitis, chronic headaches, and obesity presents for evaluation of hives that started last night.  She reports that she developed a bump on her left arm that went away and then another 1 developed on her chest which went away.  She woke up melanite itching and had hives on both arms, in both armpits, and on her thighs.  She states that she felt a burning sensation all over as well.  She denies any facial swelling or swelling of her tongue, tightness in her throat, or shortness of breath.  She has had a nonproductive cough since this began.  She denies any new personal hygiene products, body lotions, medications, or supplements.  The only different exposure is that her boyfriend washed her clothing in a different laundry detergent.  History reviewed. No pertinent past medical history.  Patient Active Problem List   Diagnosis Date Noted   Obesity, unspecified 03/16/2019   Allergic rhinitis 03/11/2012   Chronic headaches 03/11/2012    Past Surgical History:  Procedure Laterality Date   DENTAL SURGERY     WISDOM TOOTH EXTRACTION  2019    OB History     Gravida  1   Para  0   Term  0   Preterm  0   AB  1   Living  0      SAB  0   IAB  1   Ectopic  0   Multiple  0   Live Births  0            Home Medications    Prior to Admission medications   Medication Sig Start Date End Date Taking? Authorizing Provider  diphenhydrAMINE (BENADRYL) 25 MG tablet Take 2 tablets (50 mg total) by mouth at bedtime. 04/14/24  Yes Becky Augusta, NP  famotidine (PEPCID) 20 MG tablet Take 1 tablet (20 mg total) by mouth 2 (two) times daily. 04/14/24  Yes Becky Augusta, NP  fexofenadine (ALLEGRA) 180 MG  tablet Take 1 tablet (180 mg total) by mouth daily. 04/14/24  Yes Becky Augusta, NP  norethindrone-ethinyl estradiol (MICROGESTIN FE 1/20) 1-20 MG-MCG tablet Take 1 tablet by mouth daily. 05/11/20  Yes Staples, Eileen Stanford L, PA-C  predniSONE (STERAPRED UNI-PAK 21 TAB) 10 MG (21) TBPK tablet Take 6 tablets on day 1, 5 tablets day 2, 4 tablets day 3, 3 tablets day 4, 2 tablets day 5, 1 tablet day 6 04/14/24  Yes Becky Augusta, NP  azelastine (OPTIVAR) 0.05 % ophthalmic solution Place 1 drop into both eyes 2 (two) times daily as needed (allergy symptoms). 04/02/24   Radford Pax, NP  cetirizine (ZYRTEC) 10 MG tablet Take 10 mg by mouth daily.    [provider]  fluticasone (FLONASE) 50 MCG/ACT nasal spray Place 1 spray into both nostrils daily. 04/02/24   Radford Pax, NP  gabapentin (NEURONTIN) 300 MG capsule Start 1 capsule PO at bed time x 1 day then increase to 1 capsule PO BID x 1 day, then 1 capsule PO TID if tolerating 05/01/23   Shirlee Latch, PA-C  SPRINTEC 28 0.25-35 MG-MCG tablet Take 1 tablet by mouth daily. 02/27/23   [provider]    Family History Family History  Problem Relation Age of Onset   Fibromyalgia Mother    Cerebral palsy Sister    Heart attack Sister    Diabetes type I Brother    Breast cancer Neg Hx    Thrombosis Neg Hx     Social History Social History   Tobacco Use   Smoking status: Never   Smokeless tobacco: Never  Vaping Use   Vaping status: Never Used  Substance Use Topics   Alcohol use: Not Currently    Comment: 3-4x/yr   Drug use: No     Allergies   Apple juice   Review of Systems Review of Systems  HENT:  Negative for sore throat, trouble swallowing and voice change.   Respiratory:  Positive for cough. Negative for chest tightness, shortness of breath and wheezing.   Skin:  Positive for rash.     Physical Exam Triage Vital Signs ED Triage Vitals  Encounter Vitals Group     BP      Systolic BP Percentile      Diastolic BP  Percentile      Pulse      Resp      Temp      Temp src      SpO2      Weight      Height      Head Circumference      Peak Flow      Pain Score      Pain Loc      Pain Education      Exclude from Growth Chart    No data found.  Updated Vital Signs Pulse 77   Temp 98.4 F (36.9 C) (Oral)   Resp 18   LMP 04/13/2024   SpO2 96%   Visual Acuity Right Eye Distance:   Left Eye Distance:   Bilateral Distance:    Right Eye Near:   Left Eye Near:    Bilateral Near:     Physical Exam Vitals and nursing note reviewed.  Constitutional:      Appearance: Normal appearance. She is not ill-appearing.  HENT:     Head: Normocephalic and atraumatic.  Cardiovascular:     Rate and Rhythm: Normal rate and regular rhythm.     Pulses: Normal pulses.     Heart sounds: Normal heart sounds. No murmur heard.    No friction rub. No gallop.  Pulmonary:     Effort: Pulmonary effort is normal.     Breath sounds: Normal breath sounds. No stridor. No wheezing, rhonchi or rales.  Skin:    General: Skin is warm and dry.     Capillary Refill: Capillary refill takes less than 2 seconds.     Findings: Erythema and rash present.  Neurological:     General: No focal deficit present.     Mental Status: She is alert and oriented to person, place, and time.      UC Treatments / Results  Labs (all labs ordered are listed, but only abnormal results are displayed) Labs Reviewed - No data to display  EKG   Radiology No results found.  Procedures Procedures (including critical care time)  Medications Ordered in UC Medications  dexamethasone (DECADRON) injection 10 mg (has no administration in time range)  famotidine (PEPCID) tablet 20 mg (has no administration in time range)    Initial Impression / Assessment and Plan / UC Course  I have reviewed the triage vital signs and  the nursing notes.  Pertinent labs & imaging results that were available during my care of the patient were  reviewed by me and considered in my medical decision making (see chart for details).   Patient is a pleasant, nontoxic-appearing 28 year old female presenting for evaluation of hives as outlined HPI above.    Seen image above, patient has hives on both inner thighs, anterior neck, and she reports similar lesions in bilateral axilla.  No lesions on her abdomen or back at present though she reports that they were there earlier.  She did take Benadryl last night but she is unsure if it helped or not.  She is not experiencing any respiratory difficulty and her airway is patent.  No stridor when auscultating over the trachea.  Her lungs are clear to auscultation all fields.  I suspect this is most likely an allergic reaction to a new laundry detergent.  I advised her to rewash all of her bedding and clothing to eliminate any traces of the previous detergent to prevent recurrence.  I will have staff administer 10 mg of IM Decadron here in clinic and have her start a prednisone taper tomorrow morning.  I will also have staff administer 20 mg of Pepcid here in clinic and have patient continue Pepcid twice daily for the next 6 days.  She should also use Claritin, Zyrtec, or Allegra during the day and Benadryl at night to help with itching.  Return and ER precautions reviewed.   Final Clinical Impressions(s) / UC Diagnoses   Final diagnoses:  Allergic reaction, initial encounter  Hives     Discharge Instructions      Take Allegra 180 mg daily to help with itching.  You can take over-the-counter Benadryl, 50 mg at bedtime, as needed for itching and sleep.  Take the prednisone pack according to the package instructions.  You will taken on tapering dose over a period of 6 days.  Take it with food and always take it first in the morning with breakfast.  Take over-the-counter Pepcid 20 mg twice daily to help with itching as well.  If you develop any swelling of your lips or tongue, tightness in your  throat, or difficulty breathing you need to go to the ER for evaluation.      ED Prescriptions     Medication Sig Dispense Auth. Provider   predniSONE (STERAPRED UNI-PAK 21 TAB) 10 MG (21) TBPK tablet Take 6 tablets on day 1, 5 tablets day 2, 4 tablets day 3, 3 tablets day 4, 2 tablets day 5, 1 tablet day 6 21 tablet Kent Pear, NP   famotidine (PEPCID) 20 MG tablet Take 1 tablet (20 mg total) by mouth 2 (two) times daily. 30 tablet Kent Pear, NP   fexofenadine (ALLEGRA) 180 MG tablet Take 1 tablet (180 mg total) by mouth daily. 30 tablet Kent Pear, NP   diphenhydrAMINE (BENADRYL) 25 MG tablet Take 2 tablets (50 mg total) by mouth at bedtime. 30 tablet Kent Pear, NP      PDMP not reviewed this encounter.   Kent Pear, NP 04/14/24 [phone]

## 2024-04-14 NOTE — ED Triage Notes (Signed)
 Rash that started 3 days ago. Pt  states it started with one bump on her left arm then it went away then another bump on her chest that went away the next day and last night she broke out in itchy hives all over her body. Pt state she feels a burning sensation then the rash appeared and still has the burning on and off and a cough.  Taking benadryl and states it did help.

## 2024-04-14 NOTE — Discharge Instructions (Addendum)
 Take Allegra 180 mg daily to help with itching.  You can take over-the-counter Benadryl, 50 mg at bedtime, as needed for itching and sleep.  Take the prednisone pack according to the package instructions.  You will taken on tapering dose over a period of 6 days.  Take it with food and always take it first in the morning with breakfast.  Take over-the-counter Pepcid 20 mg twice daily to help with itching as well.  If you develop any swelling of your lips or tongue, tightness in your throat, or difficulty breathing you need to go to the ER for evaluation.
